# Patient Record
Sex: Male | Born: 1975 | Race: White | Hispanic: No | Marital: Married | Smoking: Current every day smoker
Health system: Southern US, Community
[De-identification: ages and names within clinical notes are randomized; demographics above are authoritative.]

## PROBLEM LIST (undated history)

## (undated) DIAGNOSIS — M5412 Radiculopathy, cervical region: Secondary | ICD-10-CM

## (undated) DIAGNOSIS — R200 Anesthesia of skin: Secondary | ICD-10-CM

## (undated) DIAGNOSIS — K219 Gastro-esophageal reflux disease without esophagitis: Secondary | ICD-10-CM

## (undated) DIAGNOSIS — I10 Essential (primary) hypertension: Secondary | ICD-10-CM

## (undated) DIAGNOSIS — L732 Hidradenitis suppurativa: Secondary | ICD-10-CM

## (undated) DIAGNOSIS — J45909 Unspecified asthma, uncomplicated: Secondary | ICD-10-CM

## (undated) DIAGNOSIS — F32A Depression, unspecified: Secondary | ICD-10-CM

## (undated) DIAGNOSIS — M1990 Unspecified osteoarthritis, unspecified site: Secondary | ICD-10-CM

## (undated) DIAGNOSIS — F329 Major depressive disorder, single episode, unspecified: Secondary | ICD-10-CM

## (undated) DIAGNOSIS — Z8614 Personal history of Methicillin resistant Staphylococcus aureus infection: Secondary | ICD-10-CM

## (undated) DIAGNOSIS — Z973 Presence of spectacles and contact lenses: Secondary | ICD-10-CM

## (undated) DIAGNOSIS — F419 Anxiety disorder, unspecified: Secondary | ICD-10-CM

## (undated) HISTORY — PX: NO PAST SURGERIES: SHX2092

---
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
## 2008-01-19 ENCOUNTER — Emergency Department: Payer: Self-pay | Admitting: Emergency Medicine

## 2008-09-08 ENCOUNTER — Ambulatory Visit: Payer: Self-pay | Admitting: Internal Medicine

## 2009-02-26 ENCOUNTER — Ambulatory Visit: Payer: Self-pay | Admitting: Internal Medicine

## 2009-03-09 ENCOUNTER — Ambulatory Visit: Payer: Self-pay | Admitting: Internal Medicine

## 2009-03-11 ENCOUNTER — Ambulatory Visit: Payer: Self-pay | Admitting: Internal Medicine

## 2009-04-07 ENCOUNTER — Ambulatory Visit: Payer: Self-pay | Admitting: Family Medicine

## 2009-05-25 ENCOUNTER — Ambulatory Visit: Payer: Self-pay | Admitting: Internal Medicine

## 2009-09-06 ENCOUNTER — Ambulatory Visit: Payer: Self-pay | Admitting: Internal Medicine

## 2010-01-04 ENCOUNTER — Ambulatory Visit: Payer: Self-pay | Admitting: Internal Medicine

## 2010-02-14 ENCOUNTER — Ambulatory Visit: Payer: Self-pay | Admitting: Internal Medicine

## 2010-04-18 ENCOUNTER — Ambulatory Visit: Payer: Self-pay | Admitting: Internal Medicine

## 2010-05-15 ENCOUNTER — Ambulatory Visit: Payer: Self-pay | Admitting: Internal Medicine

## 2010-08-13 ENCOUNTER — Ambulatory Visit: Payer: Self-pay | Admitting: Internal Medicine

## 2010-10-07 ENCOUNTER — Ambulatory Visit: Payer: Self-pay | Admitting: Internal Medicine

## 2011-03-17 ENCOUNTER — Ambulatory Visit: Payer: Self-pay

## 2011-04-12 ENCOUNTER — Ambulatory Visit: Payer: Self-pay | Admitting: Internal Medicine

## 2011-06-09 ENCOUNTER — Ambulatory Visit: Payer: Self-pay | Admitting: Internal Medicine

## 2011-06-26 ENCOUNTER — Encounter: Payer: Self-pay | Admitting: General Practice

## 2011-07-17 ENCOUNTER — Encounter: Payer: Self-pay | Admitting: General Practice

## 2011-07-19 ENCOUNTER — Ambulatory Visit: Payer: Self-pay | Admitting: Family Medicine

## 2011-07-19 ENCOUNTER — Emergency Department: Payer: Self-pay | Admitting: *Deleted

## 2011-07-19 LAB — URINALYSIS, COMPLETE
Bacteria: NEGATIVE
Bilirubin,UR: NEGATIVE
Glucose,UR: NEGATIVE mg/dL (ref 0–75)
Ketone: NEGATIVE
Leukocyte Esterase: NEGATIVE
Nitrite: NEGATIVE

## 2011-07-19 LAB — CBC WITH DIFFERENTIAL/PLATELET
Eosinophil #: 0.2 10*3/uL (ref 0.0–0.7)
Eosinophil %: 1.5 %
HCT: 50.1 % (ref 40.0–52.0)
Lymphocyte #: 0.6 10*3/uL — ABNORMAL LOW (ref 1.0–3.6)
Lymphocyte %: 4.1 %
Monocyte #: 0.4 10*3/uL (ref 0.0–0.7)
Neutrophil %: 89.2 %
RDW: 12.6 % (ref 11.5–14.5)
WBC: 14.2 10*3/uL — ABNORMAL HIGH (ref 3.8–10.6)

## 2011-07-19 LAB — COMPREHENSIVE METABOLIC PANEL
Alkaline Phosphatase: 100 U/L (ref 50–136)
BUN: 17 mg/dL (ref 7–18)
Bilirubin,Total: 0.5 mg/dL (ref 0.2–1.0)
Co2: 23 mmol/L (ref 21–32)
Potassium: 4 mmol/L (ref 3.5–5.1)
SGOT(AST): 23 U/L (ref 15–37)
SGPT (ALT): 34 U/L

## 2011-07-19 LAB — AMYLASE: Amylase: 39 U/L (ref 25–115)

## 2011-07-19 LAB — LIPASE, BLOOD: Lipase: 122 U/L (ref 73–393)

## 2011-11-24 ENCOUNTER — Ambulatory Visit: Payer: Self-pay | Admitting: Surgery

## 2012-01-17 ENCOUNTER — Other Ambulatory Visit: Payer: Self-pay | Admitting: Unknown Physician Specialty

## 2012-01-17 LAB — COMPREHENSIVE METABOLIC PANEL
Albumin: 4.3 g/dL (ref 3.4–5.0)
Alkaline Phosphatase: 99 U/L (ref 50–136)
Anion Gap: 11 (ref 7–16)
BUN: 9 mg/dL (ref 7–18)
Calcium, Total: 9.2 mg/dL (ref 8.5–10.1)
Co2: 24 mmol/L (ref 21–32)
EGFR (African American): >60
Glucose: 90 mg/dL (ref 65–99)
Osmolality: 278 (ref 275–301)
Potassium: 3.7 mmol/L (ref 3.5–5.1)
SGOT(AST): 27 U/L (ref 15–37)
SGPT (ALT): 37 U/L (ref 12–78)
Sodium: 140 mmol/L (ref 136–145)
Total Protein: 8.4 g/dL — ABNORMAL HIGH (ref 6.4–8.2)

## 2012-01-17 LAB — CBC WITH DIFFERENTIAL/PLATELET
Eosinophil %: 0.6 %
HCT: 46.1 % (ref 40.0–52.0)
Lymphocyte #: 2.6 10*3/uL (ref 1.0–3.6)
Lymphocyte %: 28.8 %
MCV: 88 fL (ref 80–100)
Monocyte %: 4.3 %
Platelet: 364 10*3/uL (ref 150–440)
RBC: 5.25 10*6/uL (ref 4.40–5.90)
WBC: 9.1 10*3/uL (ref 3.8–10.6)

## 2012-01-17 LAB — SEDIMENTATION RATE: Erythrocyte Sed Rate: 3 mm/hr (ref 0–15)

## 2012-01-26 ENCOUNTER — Ambulatory Visit: Payer: Self-pay | Admitting: Unknown Physician Specialty

## 2012-01-31 LAB — PATHOLOGY REPORT

## 2012-07-28 ENCOUNTER — Ambulatory Visit: Payer: Self-pay

## 2013-01-14 ENCOUNTER — Ambulatory Visit: Payer: Self-pay | Admitting: Urgent Care

## 2013-02-11 ENCOUNTER — Other Ambulatory Visit: Payer: Self-pay | Admitting: Family Medicine

## 2013-02-11 LAB — LIPID PANEL
Cholesterol: 267 mg/dL — ABNORMAL HIGH (ref 0–200)
Ldl Cholesterol, Calc: 160 mg/dL — ABNORMAL HIGH (ref 0–100)
Triglycerides: 113 mg/dL (ref 0–200)
VLDL Cholesterol, Calc: 23 mg/dL (ref 5–40)

## 2015-03-12 ENCOUNTER — Ambulatory Visit
Admission: EM | Admit: 2015-03-12 | Discharge: 2015-03-12 | Disposition: A | Discharge status: Home / Self Care | Payer: BLUE CROSS/BLUE SHIELD | Attending: Family Medicine | Admitting: Family Medicine

## 2015-03-12 ENCOUNTER — Encounter: Payer: Self-pay | Admitting: Emergency Medicine

## 2015-03-12 DIAGNOSIS — L02415 Cutaneous abscess of right lower limb: Secondary | ICD-10-CM

## 2015-03-12 DIAGNOSIS — L03115 Cellulitis of right lower limb: Secondary | ICD-10-CM

## 2015-03-12 MED ORDER — MUPIROCIN 2 % EX OINT: 1.0000 "application " | TOPICAL_OINTMENT | Freq: Two times a day (BID) | CUTANEOUS | Status: AC

## 2015-03-12 MED ORDER — SULFAMETHOXAZOLE-TRIMETHOPRIM 800-160 MG PO TABS: 1.0000 | ORAL_TABLET | Freq: Two times a day (BID) | ORAL | Status: AC

## 2015-03-12 MED ORDER — HYDROCODONE-ACETAMINOPHEN 5-325 MG PO TABS: 1.0000 | ORAL_TABLET | Freq: Every evening | ORAL | Status: AC | PRN

## 2015-03-12 NOTE — ED Provider Notes (Signed)
CSN: [id number]     Arrival date & time 03/12/15  1329 History   First MD Initiated Contact with Patient 03/12/15 1453     Chief Complaint  Patient presents with  . 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  +MRSA wound cultures in the past resolved with keflex or bactrim.  Has bactroban at home not using.  Does not require work not for today on paternity leave son born six weeks ago. Ultram  and motrin  along with hibiclens showers not helping.  The history is provided by the patient.    History reviewed. No pertinent past medical history. Past Surgical History  Procedure Laterality Date  . No past surgeries     No family history on file. Social History  Substance Use Topics  . Smoking status: Never Smoker   . Smokeless tobacco: None  . Alcohol Use: No    Review of Systems  Constitutional: Negative for fever and chills.  HENT: Negative for congestion, ear pain and sore throat.   Eyes: Negative for pain and discharge.  Respiratory: Negative for cough and wheezing.   Cardiovascular: Negative for chest pain and leg swelling.  Gastrointestinal: Negative for nausea, vomiting, diarrhea, constipation and blood in stool.  Genitourinary: Negative for dysuria, hematuria and difficulty urinating.  Musculoskeletal: Positive for myalgias. Negative for back pain and arthralgias.  Skin: Positive for color change and rash. Negative for pallor and wound.  Allergic/Immunologic: Negative for environmental allergies and food allergies.  Neurological: Negative for headaches.  Hematological: Negative for adenopathy. Does not bruise/bleed easily.  Psychiatric/Behavioral: Negative for confusion and agitation. The patient is not nervous/anxious.     Allergies  Ivp  dye  Home Medications   Prior to Admission medications   Medication Sig Start Date End Date Taking? Authorizing Provider  HYDROcodone-acetaminophen (NORCO/VICODIN) 5-325 MG tablet Take 1 tablet by mouth at bedtime as needed for severe pain. 03/12/15 03/17/15  Barbaraann Barthel, NP  mupirocin ointment (BACTROBAN) 2 % Apply 1 application topically 2 (two) times daily. 03/12/15 03/22/15  Barbaraann Barthel, NP  sulfamethoxazole-trimethoprim (BACTRIM DS,SEPTRA DS) 800-160 MG tablet Take 1 tablet by mouth 2 (two) times daily. 03/12/15 03/21/15  Barbaraann Barthel, NP   Meds Ordered and Administered this Visit  Medications - No data to display  BP 141/100 mmHg  Pulse 94  Temp(Src) 97.6 F (36.4 C) (Tympanic)  Resp 16  Ht  (1.702 m)  Wt 205 lb (92.987 kg)  BMI 32.10 kg/m2  SpO2 98% No data found.   Physical Exam  Constitutional: He is oriented to person, place, and time. Vital signs are normal. He appears well-developed and well-nourished. No distress.  HENT:  Head: Normocephalic and atraumatic.  Right Ear: External ear normal.  Left Ear: External ear normal.  Nose: Nose normal.  Mouth/Throat: Oropharynx is clear and moist. No oropharyngeal exudate.  Eyes: Conjunctivae, EOM and lids are normal. Pupils are equal, round, and reactive to light. Right eye exhibits no discharge. Left eye exhibits no discharge. No scleral icterus.  Neck: Trachea normal and normal range of motion. Neck supple. No tracheal deviation present.  Cardiovascular: Normal rate, regular rhythm, normal heart sounds and intact distal pulses.   Pulmonary/Chest: Effort normal and breath sounds normal. No stridor. No respiratory distress. He has no wheezes.  Abdominal: Soft.  Musculoskeletal: Normal range of motion. He exhibits  edema and tenderness.       Right shoulder: Normal.       Left shoulder: Normal.       Right elbow: Normal.      Left elbow: Normal.       Right hip: Normal.       Left hip: Normal.        Right knee: Normal.       Left knee: Normal.       Right ankle: Normal.       Left ankle: Normal.       Right hand: Normal.       Left hand: Normal.       Right upper leg: He exhibits tenderness, swelling and edema. He exhibits no bony tenderness, no deformity and no laceration.       Left upper leg: Normal.       Legs: Right medial thigh macular erythema 5x7cm fluctuance centrally less than 1x2cm induration surrounding fluctuance another cm diameter; scarring noted right medial thigh TTP hot dry  Lymphadenopathy:       Right: No inguinal adenopathy present.       Left: No inguinal adenopathy present.  Neurological: He is alert and oriented to person, place, and time. He displays no atrophy and no tremor. No cranial nerve deficit or sensory deficit. He exhibits normal muscle tone. He displays no seizure activity. Coordination and gait normal. GCS eye subscore is 4. GCS verbal subscore is 5. GCS motor subscore is 6.  Skin: Skin is warm, dry and intact. Rash noted. No abrasion, no bruising, no burn, no ecchymosis, no laceration, no lesion, no petechiae and no purpura noted. Rash is macular and pustular. Rash is not papular, not maculopapular, not nodular, not vesicular and not urticarial. He is not diaphoretic. There is erythema. No cyanosis. No pallor. Nails show no clubbing.  Psychiatric: He has a normal mood and affect. His speech is normal and behavior is normal. Judgment and thought content normal. Cognition and memory are normal.    ED Course  .Marland Kitchen.Incision and Drainage Date/Time: 03/12/2015 3:23 PM Performed by: Albina BilletBETANCOURT, Windell Musson A Authorized by: Hassan RowanWADE, EUGENE Consent: Verbal consent obtained. Risks and benefits: risks, benefits and alternatives were discussed Consent given by: patient Patient understanding: patient states understanding of the procedure being performed Site marked: the operative site was marked Patient identity confirmed: verbally with patient, provided demographic data  and arm band Time out: Immediately prior to procedure a "time out" was called to verify the correct patient, procedure, equipment, support staff and site/side marked as required. Type: abscess Body area: lower extremity Location details: right leg Anesthesia: local infiltration Local anesthetic: lidocaine 1% without epinephrine Anesthetic total: 6 ml Patient sedated: no Scalpel size: 11 Needle gauge: 25. Incision type: single straight Incision depth: subcutaneous Complexity: simple Drainage: purulent,  serosanguinous and  bloody Drainage amount: moderate Wound treatment: wound left open Packing material: none Patient tolerance: Patient tolerated the procedure well with no immediate complications Comments: Skin cleansed with hibiclens, alcohol wipes and betadine swabs.  EBL less than 5ml; yellow cloudy 2-723ml expressed after linear incision right medial thigh 0.5cm; sterile gauze pressure applied controlled bleeding triple antibiotic applied then gauze with bandaid over affected area.  Patient reported discomfort to area decreased after I&D and local infilltration.   (including critical care time)  Labs Review Labs Reviewed - No data to display  Imaging Review No results found.  Reviewed [place] Controlled substances website 2016 02/03/2015 HYDROCODONE- CHLORPHEN ER SUSP [id number] 240 24  0 0 [id number] Dortha Kern MD [place], [place] [id number] [place] [facility] [place], [place], Advith September 17, 1975 [address] [place], [place] [id number] 04 0  07/15/2014 04/03/2014 ALPRAZOLAM 0.5 MG TABLET [id number] 60 30 0 0 [id number] BLISS Doreene Nest MD [place], [place] [id number] [place] [facility] [place], [place], Lorenz Nov 24, 1975 [address] [place], [place] [id number] 04 0  05/03/2014 11/20/2013 TRAMADOL HCL 50 MG TABLET [id number] 60 [id number] BLISS Doreene Nest MD [place], [place] [id number] [place] [facility] [place], [place],  Aleck 1975-07-15 [address] [place], [place] [id number] 04 20  04/23/2014 04/23/2014 HYDROCODONE- CHLORPHEN ER SUSP [id number] 240 24 0 0 [id number] BLISS Doreene Nest MD [place], [place] [id number] [place] [facility] [place], [place], Ciel 07-Nov-1975 [address] [place], [place] [address] 04 0  04/01/2014 04/01/2014 ALPRAZOLAM 0.5 MG TABLET [id number] 60 30 0 0 [id number] BLISS Doreene Nest MD [place], [place] [id number] [place] [facility] [place], [place], Ary November 29, 1975 [address] [place], [place] [address] 04 0  03/30/2014 11/20/2013 TRAMADOL HCL 50 MG TABLET [id number] 60 [id number] BLISS Doreene Nest MD [place], [place] [id number] [place] [facility] [place], [place] Heishman, Jerid 1975-07-16 [address] [place],  [id number] 04 20   MDM   1. Abscess of right thigh   2. Cellulitis of right lower extremity    Time out performed, correct patient (patient identifiers: name and date of birth), correct procedure, correct body part, correct laterality. Pt was prepped with hibiclens, alcohol wipe and betadine swaps.  Local infiltration lesions performed with 1% lidocaine with good results.   Homeostasis was obtained with direct pressure. Superior lesion right medial thigh triple antibiotic was then applied followed by a simple dressing.  The patient tolerated the procedure well, no apparent complications.  No specimen sent to pathology.  exitcare handout on cellulitis and abscess aftercare incision and drainage given to patient.  Wound care instructions provided.  Observe for any signs of infection or other problems e.g. Fever, worsening pain/purulent drainage/swelling, red streaks. Patient to follow up with PCM if no improvement despite prescribed treatment x 48 hours.  Motrin  po TID and tylenol  po QID prn.  Ultram as prescribed may take 1 hydrocodone at bedtime if no relief with NSAIDs and ultram x 2 hours.  Bactroban apply BID affected area,  change dressing as saturated, hibiclens showers continue at least daily.  Bactrim DS po BID and bactroban or OTC antibiotic to affected areas twice a day.  May shower.  Do not soak in tub, hot tub, lake, pool until affected area healed.   Patient verbalized agreement and understanding of treatment plan and had no further questions at this time.  P2:  Wound Care, hand washing    Barbaraann Barthel, NP 03/12/15 1807

## 2015-03-12 NOTE — Discharge Instructions (Signed)
Abscess °An abscess is an infected area that contains a collection of pus and debris. It can occur in almost any part of the body. An abscess is also known as a furuncle or boil. °CAUSES  °An abscess occurs when tissue gets infected. This can occur from blockage of oil or sweat glands, infection of hair follicles, or a minor injury to the skin. As the body tries to fight the infection, pus collects in the area and creates pressure under the skin. This pressure causes pain. People with weakened immune systems have difficulty fighting infections and get certain abscesses more often.  °SYMPTOMS °Usually an abscess develops on the skin and becomes a painful mass that is red, warm, and tender. If the abscess forms under the skin, you may feel a moveable soft area under the skin. Some abscesses break open (rupture) on their own, but most will continue to get worse without care. The infection can spread deeper into the body and eventually into the bloodstream, causing you to feel ill.  °DIAGNOSIS  °Your caregiver will take your medical history and perform a physical exam. A sample of fluid may also be taken from the abscess to determine what is causing your infection. °TREATMENT  °Your caregiver may prescribe antibiotic medicines to fight the infection. However, taking antibiotics alone usually does not cure an abscess. Your caregiver may need to make a small cut (incision) in the abscess to drain the pus. In some cases, gauze is packed into the abscess to reduce pain and to continue draining the area. °HOME CARE INSTRUCTIONS  °· Only take over-the-counter or prescription medicines for pain, discomfort, or fever as directed by your caregiver. °· If you were prescribed antibiotics, take them as directed. Finish them even if you start to feel better. °· If gauze is used, follow your caregiver's directions for changing the gauze. °· To avoid spreading the infection: °· Keep your draining abscess covered with a  bandage. °· Wash your hands well. °· Do not share personal care items, towels, or whirlpools with others. °· Avoid skin contact with others. °· Keep your skin and clothes clean around the abscess. °· Keep all follow-up appointments as directed by your caregiver. °SEEK MEDICAL CARE IF:  °· You have increased pain, swelling, redness, fluid drainage, or bleeding. °· You have muscle aches, chills, or a general ill feeling. °· You have a fever. °MAKE SURE YOU:  °· Understand these instructions. °· Will watch your condition. °· Will get help right away if you are not doing well or get worse. °  °This information is not intended to replace advice given to you by your health care provider. Make sure you discuss any questions you have with your health care provider. °  °Document Released: 01/11/2005 Document Revised: 10/03/2011 Document Reviewed: 06/16/2011 °Elsevier Interactive Patient Education ©2016 [employer] Inc[employer] ° °Cellulitis °Cellulitis is an infection of the skin and the tissue beneath it. The infected area is usually red and tender. Cellulitis occurs most often in the arms and lower legs.  °CAUSES  °Cellulitis is caused by bacteria that enter the skin through cracks or cuts in the skin. The most common types of bacteria that cause cellulitis are staphylococci and streptococci. °SIGNS AND SYMPTOMS  °· Redness and warmth. °· Swelling. °· Tenderness or pain. °· Fever. °DIAGNOSIS  °Your health care provider can usually determine what is wrong based on a physical exam. Blood tests may also be done. °TREATMENT  °Treatment usually involves taking an antibiotic medicine. °HOME CARE INSTRUCTIONS  °·   Take your antibiotic medicine as directed by your health care provider. Finish the antibiotic even if you start to feel better. °· Keep the infected arm or leg elevated to reduce swelling. °· Apply a warm cloth to the affected area up to 4 times per day to relieve pain. °· Take medicines only as directed by your health care  provider. °· Keep all follow-up visits as directed by your health care provider. °SEEK MEDICAL CARE IF:  °· You notice red streaks coming from the infected area. °· Your red area gets larger or turns dark in color. °· Your bone or joint underneath the infected area becomes painful after the skin has healed. °· Your infection returns in the same area or another area. °· You notice a swollen bump in the infected area. °· You develop new symptoms. °· You have a fever. °SEEK IMMEDIATE MEDICAL CARE IF:  °· You feel very sleepy. °· You develop vomiting or diarrhea. °· You have a general ill feeling (malaise) with muscle aches and pains. °  °This information is not intended to replace advice given to you by your health care provider. Make sure you discuss any questions you have with your health care provider. °  °Document Released: 01/11/2005 Document Revised: 12/23/2014 Document Reviewed: 06/19/2011 °Elsevier Interactive Patient Education ©2016 [employer] Inc[employer] ° °Incision and Drainage °Incision and drainage is a procedure in which a sac-like structure (cystic structure) is opened and drained. The area to be drained usually contains material such as pus, fluid, or blood.  °LET YOUR CAREGIVER KNOW ABOUT:  °· Allergies to medicine. °· Medicines taken, including vitamins, herbs, eyedrops, over-the-counter medicines, and creams. °· Use of steroids (by mouth or creams). °· Previous problems with anesthetics or numbing medicines. °· History of bleeding problems or blood clots. °· Previous surgery. °· Other health problems, including diabetes and kidney problems. °· Possibility of pregnancy, if this applies. °RISKS AND COMPLICATIONS °· Pain. °· Bleeding. °· Scarring. °· Infection. °BEFORE THE PROCEDURE  °You may need to have an ultrasound or other imaging tests to see how large or deep your cystic structure is. Blood tests may also be used to determine if you have an infection or how severe the infection is. You may need to have a  tetanus shot. °PROCEDURE  °The affected area is cleaned with a cleaning fluid. The cyst area will then be numbed with a medicine (local anesthetic). A small incision will be made in the cystic structure. A syringe or catheter may be used to drain the contents of the cystic structure, or the contents may be squeezed out. The area will then be flushed with a cleansing solution. After cleansing the area, it is often gently packed with a gauze or another wound dressing. Once it is packed, it will be covered with gauze and tape or some other type of wound dressing.  °AFTER THE PROCEDURE  °· Often, you will be allowed to go home right after the procedure. °· You may be given antibiotic medicine to prevent or heal an infection. °· If the area was packed with gauze or some other wound dressing, you will likely need to come back in 1 to 2 days to get it removed. °· The area should heal in about 14 days. °  °This information is not intended to replace advice given to you by your health care provider. Make sure you discuss any questions you have with your health care provider. °  °Document Released: 09/27/2000 Document Revised: 10/03/2011 Document Reviewed: 05/29/2011 °  Elsevier Interactive Patient Education ©2016 Elsevier Inc. ° °

## 2015-03-12 NOTE — ED Notes (Signed)
Right inner thigh abscess  for 5 days. Large, red and painful.

## 2015-04-01 IMAGING — CR DG CHEST 2V
1 series · 1 of 1 positions shown · non-contrast
Comparison: none

REASON FOR EXAM: cold sweats
COMMENTS:

[w chest pa]
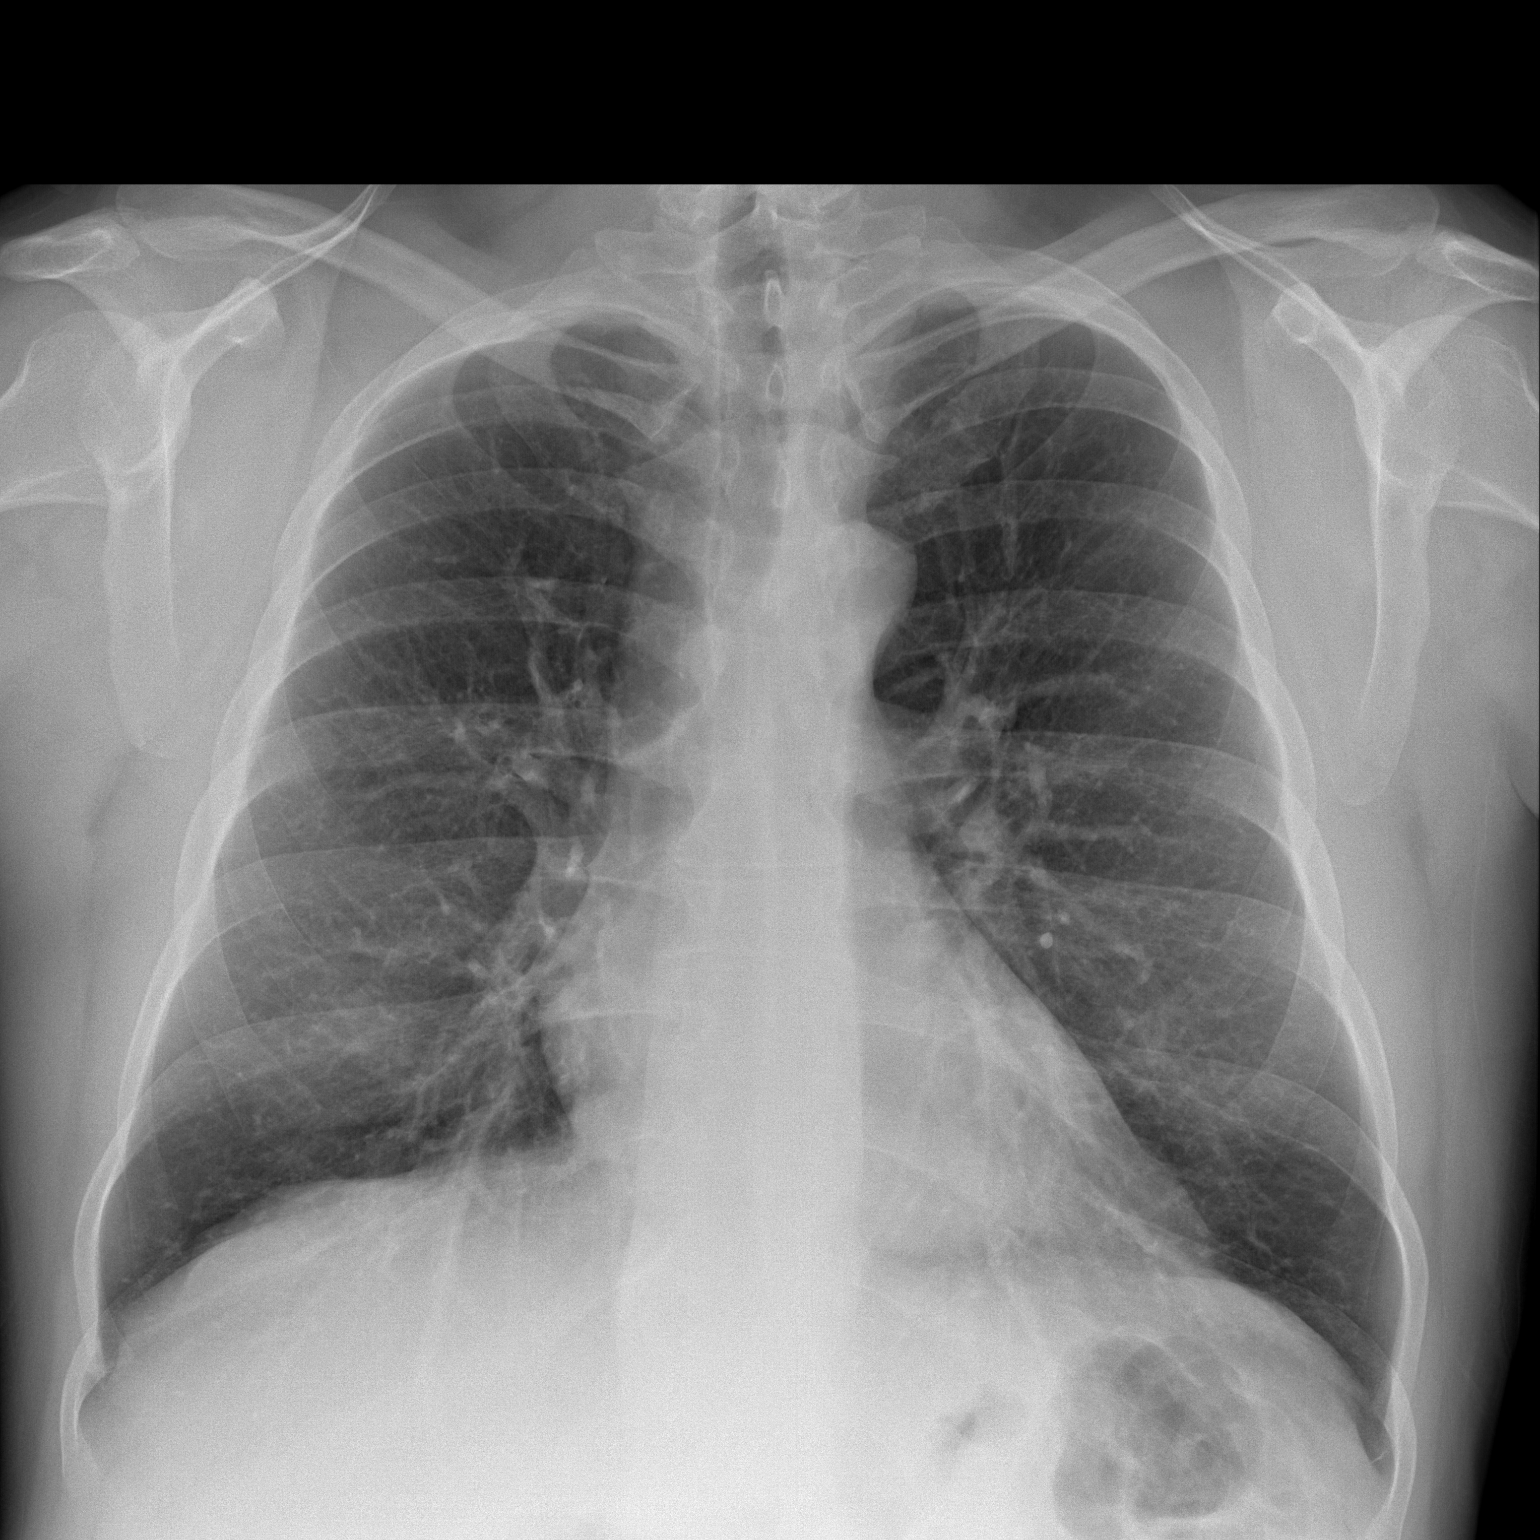

[1 of 1 positions shown; findings below may reference images not displayed]

PROCEDURE:     DXR - DXR CHEST PA (OR AP) AND LATERAL  - January 14, 2013 [DATE]

RESULT:     The lungs are well-expanded. There is no focal infiltrate. The
cardiac silhouette is normal in size. The pulmonary vascularity is not
engorged. The mediastinum is normal in width. There is no pleural effusion
or pneumothorax. The observed portions of the bony thorax appear normal.
IMPRESSION: There is no evidence of acute cardiopulmonary abnormality.

[REDACTED]

## 2015-04-18 DIAGNOSIS — Z8614 Personal history of Methicillin resistant Staphylococcus aureus infection: Secondary | ICD-10-CM

## 2015-04-18 DIAGNOSIS — L732 Hidradenitis suppurativa: Secondary | ICD-10-CM

## 2015-04-18 HISTORY — DX: Personal history of Methicillin resistant Staphylococcus aureus infection: Z86.14

## 2015-04-18 HISTORY — DX: Hidradenitis suppurativa: L73.2

## 2015-07-14 ENCOUNTER — Ambulatory Visit
Admission: EM | Admit: 2015-07-14 | Discharge: 2015-07-14 | Discharge status: Absent without leave | Payer: BLUE CROSS/BLUE SHIELD

## 2019-01-27 ENCOUNTER — Other Ambulatory Visit: Payer: Self-pay

## 2019-01-27 ENCOUNTER — Other Ambulatory Visit (HOSPITAL_COMMUNITY)
Admission: RE | Admit: 2019-01-27 | Discharge: 2019-01-27 | Disposition: A | Discharge status: Home / Self Care | Payer: BC Managed Care – PPO | Source: Ambulatory Visit | Attending: Orthopaedic Surgery | Admitting: Orthopaedic Surgery

## 2019-01-27 ENCOUNTER — Encounter (HOSPITAL_BASED_OUTPATIENT_CLINIC_OR_DEPARTMENT_OTHER)
Admission: RE | Admit: 2019-01-27 | Discharge: 2019-01-27 | Disposition: A | Discharge status: Home / Self Care | Payer: BC Managed Care – PPO | Source: Ambulatory Visit | Attending: Orthopaedic Surgery | Admitting: Orthopaedic Surgery

## 2019-01-27 ENCOUNTER — Encounter (HOSPITAL_BASED_OUTPATIENT_CLINIC_OR_DEPARTMENT_OTHER): Payer: Self-pay | Admitting: *Deleted

## 2019-01-27 DIAGNOSIS — M19011 Primary osteoarthritis, right shoulder: Secondary | ICD-10-CM | POA: Diagnosis not present

## 2019-01-27 DIAGNOSIS — Z87891 Personal history of nicotine dependence: Secondary | ICD-10-CM | POA: Diagnosis not present

## 2019-01-27 DIAGNOSIS — X58XXXA Exposure to other specified factors, initial encounter: Secondary | ICD-10-CM | POA: Diagnosis not present

## 2019-01-27 DIAGNOSIS — Z01812 Encounter for preprocedural laboratory examination: Secondary | ICD-10-CM | POA: Insufficient documentation

## 2019-01-27 DIAGNOSIS — F329 Major depressive disorder, single episode, unspecified: Secondary | ICD-10-CM | POA: Diagnosis not present

## 2019-01-27 DIAGNOSIS — J45909 Unspecified asthma, uncomplicated: Secondary | ICD-10-CM | POA: Diagnosis not present

## 2019-01-27 DIAGNOSIS — Z20828 Contact with and (suspected) exposure to other viral communicable diseases: Secondary | ICD-10-CM | POA: Insufficient documentation

## 2019-01-27 DIAGNOSIS — I10 Essential (primary) hypertension: Secondary | ICD-10-CM | POA: Diagnosis not present

## 2019-01-27 DIAGNOSIS — Z791 Long term (current) use of non-steroidal anti-inflammatories (NSAID): Secondary | ICD-10-CM | POA: Diagnosis not present

## 2019-01-27 DIAGNOSIS — S43431A Superior glenoid labrum lesion of right shoulder, initial encounter: Secondary | ICD-10-CM | POA: Diagnosis not present

## 2019-01-27 DIAGNOSIS — F419 Anxiety disorder, unspecified: Secondary | ICD-10-CM | POA: Diagnosis not present

## 2019-01-27 DIAGNOSIS — Z79899 Other long term (current) drug therapy: Secondary | ICD-10-CM | POA: Diagnosis not present

## 2019-01-27 DIAGNOSIS — M7541 Impingement syndrome of right shoulder: Secondary | ICD-10-CM | POA: Diagnosis not present

## 2019-01-27 NOTE — Progress Notes (Signed)

## 2019-01-27 NOTE — Progress Notes (Signed)
UTR pt. Spoke wit Sherri at Dr Sharyon Cable and asked her to try to reach pt and have him call me. Will need PAT appt and needs covid screen today.

## 2019-01-28 LAB — SARS CORONAVIRUS 2 (TAT 6-24 HRS): SARS Coronavirus 2: NEGATIVE

## 2019-01-29 NOTE — Anesthesia Preprocedure Evaluation (Addendum)
Anesthesia Evaluation  Patient identified by MRN, date of birth, ID band Patient awake    Reviewed: Allergy & Precautions, NPO status , Patient's Chart, lab work & pertinent test results, reviewed documented beta blocker date and time   History of Anesthesia Complications Negative for: history of anesthetic complications  Airway Mallampati: III  TM Distance: >3 FB Neck ROM: Full    Dental no notable dental hx. (+) Dental Advisory Given   Pulmonary neg pulmonary ROS, Patient abstained from smoking., former smoker,    Pulmonary exam normal        Cardiovascular hypertension, Pt. on home beta blockers Normal cardiovascular exam     Neuro/Psych PSYCHIATRIC DISORDERS Anxiety Depression negative neurological ROS     GI/Hepatic negative GI ROS, Neg liver ROS,   Endo/Other  negative endocrine ROS  Renal/GU negative Renal ROS     Musculoskeletal negative musculoskeletal ROS (+)   Abdominal   Peds  Hematology negative hematology ROS (+)   Anesthesia Other Findings Day of surgery medications reviewed with the patient.  Reproductive/Obstetrics                            Anesthesia Physical Anesthesia Plan  ASA: II  Anesthesia Plan: General   Post-op Pain Management:  Regional for Post-op pain   Induction: Intravenous  PONV Risk Score and Plan: 2 and Ondansetron and Scopolamine patch - Pre-op  Airway Management Planned: Oral ETT  Additional Equipment:   Intra-op Plan:   Post-operative Plan: Extubation in OR  Informed Consent: I have reviewed the patients History and Physical, chart, labs and discussed the procedure including the risks, benefits and alternatives for the proposed anesthesia with the patient or authorized representative who has indicated his/her understanding and acceptance.     Dental advisory given  Plan Discussed with: CRNA and Anesthesiologist  Anesthesia Plan  Comments:        Anesthesia Quick Evaluation

## 2019-01-30 ENCOUNTER — Encounter (HOSPITAL_BASED_OUTPATIENT_CLINIC_OR_DEPARTMENT_OTHER): Payer: Self-pay

## 2019-01-30 ENCOUNTER — Ambulatory Visit (HOSPITAL_BASED_OUTPATIENT_CLINIC_OR_DEPARTMENT_OTHER): Payer: BC Managed Care – PPO | Admitting: Anesthesiology

## 2019-01-30 ENCOUNTER — Other Ambulatory Visit: Payer: Self-pay

## 2019-01-30 ENCOUNTER — Encounter (HOSPITAL_BASED_OUTPATIENT_CLINIC_OR_DEPARTMENT_OTHER)
Admission: RE | Disposition: A | Discharge status: Home / Self Care | Payer: Self-pay | Source: Home / Self Care | Attending: Orthopaedic Surgery

## 2019-01-30 ENCOUNTER — Ambulatory Visit (HOSPITAL_BASED_OUTPATIENT_CLINIC_OR_DEPARTMENT_OTHER)
Admission: RE | Admit: 2019-01-30 | Discharge: 2019-01-30 | Disposition: A | Discharge status: Home / Self Care | Payer: BC Managed Care – PPO | Attending: Orthopaedic Surgery | Admitting: Orthopaedic Surgery

## 2019-01-30 DIAGNOSIS — Z791 Long term (current) use of non-steroidal anti-inflammatories (NSAID): Secondary | ICD-10-CM | POA: Insufficient documentation

## 2019-01-30 DIAGNOSIS — I10 Essential (primary) hypertension: Secondary | ICD-10-CM | POA: Insufficient documentation

## 2019-01-30 DIAGNOSIS — F419 Anxiety disorder, unspecified: Secondary | ICD-10-CM | POA: Insufficient documentation

## 2019-01-30 DIAGNOSIS — X58XXXA Exposure to other specified factors, initial encounter: Secondary | ICD-10-CM | POA: Insufficient documentation

## 2019-01-30 DIAGNOSIS — J45909 Unspecified asthma, uncomplicated: Secondary | ICD-10-CM | POA: Insufficient documentation

## 2019-01-30 DIAGNOSIS — Z87891 Personal history of nicotine dependence: Secondary | ICD-10-CM | POA: Insufficient documentation

## 2019-01-30 DIAGNOSIS — S43431A Superior glenoid labrum lesion of right shoulder, initial encounter: Secondary | ICD-10-CM | POA: Insufficient documentation

## 2019-01-30 DIAGNOSIS — M7541 Impingement syndrome of right shoulder: Secondary | ICD-10-CM | POA: Insufficient documentation

## 2019-01-30 DIAGNOSIS — F329 Major depressive disorder, single episode, unspecified: Secondary | ICD-10-CM | POA: Insufficient documentation

## 2019-01-30 DIAGNOSIS — Z79899 Other long term (current) drug therapy: Secondary | ICD-10-CM | POA: Insufficient documentation

## 2019-01-30 DIAGNOSIS — M19011 Primary osteoarthritis, right shoulder: Secondary | ICD-10-CM | POA: Diagnosis not present

## 2019-01-30 HISTORY — DX: Gastro-esophageal reflux disease without esophagitis: K21.9

## 2019-01-30 HISTORY — DX: Unspecified asthma, uncomplicated: J45.909

## 2019-01-30 HISTORY — DX: Anxiety disorder, unspecified: F41.9

## 2019-01-30 HISTORY — DX: Depression, unspecified: F32.A

## 2019-01-30 HISTORY — PX: BICEPT TENODESIS: SHX5116

## 2019-01-30 HISTORY — DX: Essential (primary) hypertension: I10

## 2019-01-30 SURGERY — SHOULDER ARTHROSCOPY WITH SUBACROMIAL DECOMPRESSION AND DISTAL CLAVICLE EXCISION
Anesthesia: General | Site: Shoulder | Laterality: Right

## 2019-01-30 MED ORDER — ACETAMINOPHEN 500 MG PO TABS: 1000.0000 mg | ORAL_TABLET | Freq: Three times a day (TID) | ORAL | 0 refills | Status: AC

## 2019-01-30 MED ORDER — ROCURONIUM BROMIDE 10 MG/ML (PF) SYRINGE
PREFILLED_SYRINGE | INTRAVENOUS | Status: AC
  Filled 2019-01-30: qty 10

## 2019-01-30 MED ORDER — DEXAMETHASONE SODIUM PHOSPHATE 10 MG/ML IJ SOLN
INTRAMUSCULAR | Status: AC
  Filled 2019-01-30: qty 1

## 2019-01-30 MED ORDER — LIDOCAINE HCL (CARDIAC) PF 100 MG/5ML IV SOSY
PREFILLED_SYRINGE | INTRAVENOUS | Status: DC | PRN
  Administered 2019-01-30: 40 mg via INTRAVENOUS

## 2019-01-30 MED ORDER — ACETAMINOPHEN 500 MG PO TABS
ORAL_TABLET | ORAL | Status: AC
  Filled 2019-01-30: qty 2

## 2019-01-30 MED ORDER — CELECOXIB 200 MG PO CAPS: 200.0000 mg | ORAL_CAPSULE | Freq: Two times a day (BID) | ORAL | 1 refills | Status: AC

## 2019-01-30 MED ORDER — ROCURONIUM BROMIDE 100 MG/10ML IV SOLN
INTRAVENOUS | Status: DC | PRN
  Administered 2019-01-30: 70 mg via INTRAVENOUS

## 2019-01-30 MED ORDER — CEFAZOLIN SODIUM-DEXTROSE 2-4 GM/100ML-% IV SOLN
2.0000 g | INTRAVENOUS | Status: AC
  Administered 2019-01-30: 2 g via INTRAVENOUS

## 2019-01-30 MED ORDER — EPINEPHRINE PF 1 MG/ML IJ SOLN
INTRAMUSCULAR | Status: AC
  Filled 2019-01-30: qty 8

## 2019-01-30 MED ORDER — FENTANYL CITRATE (PF) 100 MCG/2ML IJ SOLN
25.0000 ug | INTRAMUSCULAR | Status: DC | PRN
  Administered 2019-01-30: 50 ug via INTRAVENOUS
  Administered 2019-01-30: 25 ug via INTRAVENOUS

## 2019-01-30 MED ORDER — PROMETHAZINE HCL 25 MG/ML IJ SOLN: 6.2500 mg | INTRAMUSCULAR | Status: DC | PRN

## 2019-01-30 MED ORDER — FENTANYL CITRATE (PF) 100 MCG/2ML IJ SOLN
50.0000 ug | INTRAMUSCULAR | Status: DC | PRN
  Administered 2019-01-30: 100 ug via INTRAVENOUS

## 2019-01-30 MED ORDER — ONDANSETRON HCL 4 MG PO TABS: 4.0000 mg | ORAL_TABLET | Freq: Three times a day (TID) | ORAL | 1 refills | Status: AC | PRN

## 2019-01-30 MED ORDER — ONDANSETRON HCL 4 MG/2ML IJ SOLN
INTRAMUSCULAR | Status: DC | PRN
  Administered 2019-01-30: 4 mg via INTRAVENOUS

## 2019-01-30 MED ORDER — ACETAMINOPHEN 500 MG PO TABS
1000.0000 mg | ORAL_TABLET | Freq: Once | ORAL | Status: AC
  Administered 2019-01-30: 1000 mg via ORAL

## 2019-01-30 MED ORDER — CHLORHEXIDINE GLUCONATE 4 % EX LIQD: 60.0000 mL | Freq: Once | CUTANEOUS | Status: DC

## 2019-01-30 MED ORDER — DEXAMETHASONE SODIUM PHOSPHATE 4 MG/ML IJ SOLN
INTRAMUSCULAR | Status: DC | PRN
  Administered 2019-01-30: 5 mg via INTRAVENOUS

## 2019-01-30 MED ORDER — SODIUM CHLORIDE 0.9 % IR SOLN
Status: DC | PRN
  Administered 2019-01-30: 9500 mL
  Administered 2019-01-30: 6000 mL

## 2019-01-30 MED ORDER — CELECOXIB 400 MG PO CAPS
400.0000 mg | ORAL_CAPSULE | Freq: Once | ORAL | Status: AC
  Administered 2019-01-30: 400 mg via ORAL

## 2019-01-30 MED ORDER — OXYCODONE HCL 5 MG PO TABS: ORAL_TABLET | ORAL | 0 refills | Status: AC

## 2019-01-30 MED ORDER — LABETALOL HCL 5 MG/ML IV SOLN
INTRAVENOUS | Status: DC | PRN
  Administered 2019-01-30: 5 mg via INTRAVENOUS

## 2019-01-30 MED ORDER — MIDAZOLAM HCL 2 MG/2ML IJ SOLN
INTRAMUSCULAR | Status: AC
  Filled 2019-01-30: qty 2

## 2019-01-30 MED ORDER — CELECOXIB 200 MG PO CAPS
ORAL_CAPSULE | ORAL | Status: AC
  Filled 2019-01-30: qty 1

## 2019-01-30 MED ORDER — BUPIVACAINE LIPOSOME 1.3 % IJ SUSP
INTRAMUSCULAR | Status: DC | PRN
  Administered 2019-01-30: 10 mL via PERINEURAL

## 2019-01-30 MED ORDER — SUGAMMADEX SODIUM 200 MG/2ML IV SOLN
INTRAVENOUS | Status: DC | PRN
  Administered 2019-01-30: 200 mg via INTRAVENOUS

## 2019-01-30 MED ORDER — FENTANYL CITRATE (PF) 100 MCG/2ML IJ SOLN
INTRAMUSCULAR | Status: AC
  Filled 2019-01-30: qty 2

## 2019-01-30 MED ORDER — ONDANSETRON HCL 4 MG/2ML IJ SOLN
INTRAMUSCULAR | Status: AC
  Filled 2019-01-30: qty 2

## 2019-01-30 MED ORDER — PROPOFOL 10 MG/ML IV BOLUS
INTRAVENOUS | Status: DC | PRN
  Administered 2019-01-30: 20 mg via INTRAVENOUS
  Administered 2019-01-30: 30 mg via INTRAVENOUS
  Administered 2019-01-30: 200 mg via INTRAVENOUS

## 2019-01-30 MED ORDER — ALBUTEROL SULFATE (2.5 MG/3ML) 0.083% IN NEBU
2.5000 mg | INHALATION_SOLUTION | Freq: Once | RESPIRATORY_TRACT | Status: AC
  Administered 2019-01-30: 2.5 mg via RESPIRATORY_TRACT

## 2019-01-30 MED ORDER — SCOPOLAMINE 1 MG/3DAYS TD PT72
MEDICATED_PATCH | TRANSDERMAL | Status: AC
  Filled 2019-01-30: qty 1

## 2019-01-30 MED ORDER — SCOPOLAMINE 1 MG/3DAYS TD PT72
1.0000 | MEDICATED_PATCH | TRANSDERMAL | Status: DC
  Administered 2019-01-30: 1.5 mg via TRANSDERMAL

## 2019-01-30 MED ORDER — LIDOCAINE 2% (20 MG/ML) 5 ML SYRINGE
INTRAMUSCULAR | Status: AC
  Filled 2019-01-30: qty 5

## 2019-01-30 MED ORDER — MIDAZOLAM HCL 2 MG/2ML IJ SOLN
1.0000 mg | INTRAMUSCULAR | Status: DC | PRN
  Administered 2019-01-30: 2 mg via INTRAVENOUS

## 2019-01-30 MED ORDER — CEFAZOLIN SODIUM-DEXTROSE 2-4 GM/100ML-% IV SOLN
INTRAVENOUS | Status: AC
  Filled 2019-01-30: qty 100

## 2019-01-30 MED ORDER — LACTATED RINGERS IV SOLN
INTRAVENOUS | Status: DC
  Administered 2019-01-30 (×2): via INTRAVENOUS

## 2019-01-30 MED ORDER — ALBUTEROL SULFATE (2.5 MG/3ML) 0.083% IN NEBU
INHALATION_SOLUTION | RESPIRATORY_TRACT | Status: AC
  Filled 2019-01-30: qty 3

## 2019-01-30 MED ORDER — BUPIVACAINE HCL (PF) 0.25 % IJ SOLN
INTRAMUSCULAR | Status: DC | PRN
  Administered 2019-01-30: 15 mL

## 2019-01-30 SURGICAL SUPPLY — 67 items
ANCHOR SUT 1.8 FBRTK KNTLS 2SU (Anchor) ×6 IMPLANT
BENZOIN TINCTURE PRP APPL 2/3 (GAUZE/BANDAGES/DRESSINGS) ×3 IMPLANT
BLADE EXCALIBUR 4.0MM X 13CM (MISCELLANEOUS) ×1
BLADE EXCALIBUR 4.0X13 (MISCELLANEOUS) ×2 IMPLANT
BLADE SURG 10 STRL SS (BLADE) IMPLANT
BURR OVAL 8 FLU 4.0MM X 13CM (MISCELLANEOUS)
BURR OVAL 8 FLU 4.0X13 (MISCELLANEOUS) IMPLANT
CANNULA 5.75X71 LONG (CANNULA) IMPLANT
CANNULA PASSPORT 5 (CANNULA) ×2 IMPLANT
CANNULA PASSPORT 5CM (CANNULA) ×1
CANNULA PASSPORT BUTTON 10-40 (CANNULA) ×3 IMPLANT
CANNULA TWIST IN 8.25X7CM (CANNULA) IMPLANT
CHLORAPREP W/TINT 26 (MISCELLANEOUS) ×3 IMPLANT
CLOSURE WOUND 1/2 X4 (GAUZE/BANDAGES/DRESSINGS) ×1
COVER WAND RF STERILE (DRAPES) IMPLANT
DECANTER SPIKE VIAL GLASS SM (MISCELLANEOUS) IMPLANT
DISSECTOR 3.5MM X 13CM CVD (MISCELLANEOUS) IMPLANT
DISSECTOR 4.0MMX13CM CVD (MISCELLANEOUS) IMPLANT
DRAPE HALF SHEET 70X43 (DRAPES) IMPLANT
DRAPE IMP U-DRAPE 54X76 (DRAPES) ×3 IMPLANT
DRAPE INCISE IOBAN 66X45 STRL (DRAPES) IMPLANT
DRAPE SHOULDER BEACH CHAIR (DRAPES) ×3 IMPLANT
DRSG PAD ABDOMINAL 8X10 ST (GAUZE/BANDAGES/DRESSINGS) ×3 IMPLANT
DW OUTFLOW CASSETTE/TUBE SET (MISCELLANEOUS) ×3 IMPLANT
GAUZE SPONGE 4X4 12PLY STRL (GAUZE/BANDAGES/DRESSINGS) ×3 IMPLANT
GLOVE BIO SURGEON STRL SZ 6.5 (GLOVE) ×2 IMPLANT
GLOVE BIO SURGEONS STRL SZ 6.5 (GLOVE) ×1
GLOVE BIOGEL PI IND STRL 6.5 (GLOVE) ×1 IMPLANT
GLOVE BIOGEL PI IND STRL 7.0 (GLOVE) ×1 IMPLANT
GLOVE BIOGEL PI IND STRL 8 (GLOVE) ×1 IMPLANT
GLOVE BIOGEL PI INDICATOR 6.5 (GLOVE) ×2
GLOVE BIOGEL PI INDICATOR 7.0 (GLOVE) ×2
GLOVE BIOGEL PI INDICATOR 8 (GLOVE) ×2
GLOVE ECLIPSE 6.5 STRL STRAW (GLOVE) ×3 IMPLANT
GLOVE ECLIPSE 8.0 STRL XLNG CF (GLOVE) ×3 IMPLANT
GOWN STRL REUS W/ TWL LRG LVL3 (GOWN DISPOSABLE) ×2 IMPLANT
GOWN STRL REUS W/TWL LRG LVL3 (GOWN DISPOSABLE) ×4
GOWN STRL REUS W/TWL XL LVL3 (GOWN DISPOSABLE) ×3 IMPLANT
KIT STABILIZATION SHOULDER (MISCELLANEOUS) ×3 IMPLANT
KIT STR SPEAR 1.8 FBRTK DISP (KITS) ×3 IMPLANT
LASSO CRESCENT QUICKPASS (SUTURE) IMPLANT
MANIFOLD NEPTUNE II (INSTRUMENTS) ×3 IMPLANT
NDL SAFETY ECLIPSE 18X1.5 (NEEDLE) ×1 IMPLANT
NEEDLE HYPO 18GX1.5 SHARP (NEEDLE) ×2
NEEDLE SCORPION MULTI FIRE (NEEDLE) IMPLANT
PACK ARTHROSCOPY DSU (CUSTOM PROCEDURE TRAY) ×3 IMPLANT
PACK BASIN DAY SURGERY FS (CUSTOM PROCEDURE TRAY) ×3 IMPLANT
PAD ORTHO SHOULDER 7X19 LRG (SOFTGOODS) ×3 IMPLANT
PORT APPOLLO RF 90DEGREE MULTI (SURGICAL WAND) ×3 IMPLANT
RESTRAINT HEAD UNIVERSAL NS (MISCELLANEOUS) ×3 IMPLANT
SLEEVE SCD COMPRESS KNEE MED (MISCELLANEOUS) ×3 IMPLANT
SLING ARM FOAM STRAP LRG (SOFTGOODS) IMPLANT
STRIP CLOSURE SKIN 1/2X4 (GAUZE/BANDAGES/DRESSINGS) ×2 IMPLANT
SUT FIBERWIRE #2 38 T-5 BLUE (SUTURE)
SUT MNCRL AB 4-0 PS2 18 (SUTURE) ×3 IMPLANT
SUT PDS AB 1 CT  36 (SUTURE) ×2
SUT PDS AB 1 CT 36 (SUTURE) ×1 IMPLANT
SUT TIGER TAPE 7 IN WHITE (SUTURE) IMPLANT
SUTURE FIBERWR #2 38 T-5 BLUE (SUTURE) IMPLANT
SUTURE TAPE TIGERLINK 1.3MM BL (SUTURE) IMPLANT
SUTURETAPE TIGERLINK 1.3MM BL (SUTURE)
SYR 5ML LL (SYRINGE) ×3 IMPLANT
TAPE FIBER 2MM 7IN #2 BLUE (SUTURE) IMPLANT
TOWEL GREEN STERILE FF (TOWEL DISPOSABLE) ×3 IMPLANT
TUBE CONNECTING 20'X1/4 (TUBING) ×1
TUBE CONNECTING 20X1/4 (TUBING) ×2 IMPLANT
TUBING ARTHROSCOPY IRRIG 16FT (MISCELLANEOUS) ×3 IMPLANT

## 2019-01-30 NOTE — Discharge Instructions (Signed)
No Tylenol until 12:30pm No Ibuprofen until 2:30pm   Post Anesthesia Home Care Instructions  Activity: Get plenty of rest for the remainder of the day. A responsible individual must stay with you for 24 hours following the procedure.  For the next 24 hours, DO NOT: -Drive a car -Paediatric nurse -Drink alcoholic beverages -Take any medication unless instructed by your physician -Make any legal decisions or sign important papers.  Meals: Start with liquid foods such as gelatin or soup. Progress to regular foods as tolerated. Avoid greasy, spicy, heavy foods. If nausea and/or vomiting occur, drink only clear liquids until the nausea and/or vomiting subsides. Call your physician if vomiting continues.  Special Instructions/Symptoms: Your throat may feel dry or sore from the anesthesia or the breathing tube placed in your throat during surgery. If this causes discomfort, gargle with warm salt water. The discomfort should disappear within 24 hours.  If you had a scopolamine patch placed behind your ear for the management of post- operative nausea and/or vomiting:  1. The medication in the patch is effective for 72 hours, after which it should be removed.  Wrap patch in a tissue and discard in the trash. Wash hands thoroughly with soap and water. 2. You may remove the patch earlier than 72 hours if you experience unpleasant side effects which may include dry mouth, dizziness or visual disturbances. 3. Avoid touching the patch. Wash your hands with soap and water after contact with the patch.    Regional Anesthesia Blocks  1. Numbness or the inability to move the "blocked" extremity may last from 3-48 hours after placement. The length of time depends on the medication injected and your individual response to the medication. If the numbness is not going away after 48 hours, call your surgeon.  2. The extremity that is blocked will need to be protected until the numbness is gone and the   Strength has returned. Because you cannot feel it, you will need to take extra care to avoid injury. Because it may be weak, you may have difficulty moving it or using it. You may not know what position it is in without looking at it while the block is in effect.  3. For blocks in the legs and feet, returning to weight bearing and walking needs to be done carefully. You will need to wait until the numbness is entirely gone and the strength has returned. You should be able to move your leg and foot normally before you try and bear weight or walk. You will need someone to be with you when you first try to ensure you do not fall and possibly risk injury.  4. Bruising and tenderness at the needle site are common side effects and will resolve in a few days.  5. Persistent numbness or new problems with movement should be communicated to the surgeon or the [place] [phone] [place] [phone]).  Information for Discharge Teaching: EXPAREL (bupivacaine liposome injectable suspension)   Your surgeon or anesthesiologist gave you EXPAREL(bupivacaine) to help control your pain after surgery.   EXPAREL is a local anesthetic that provides pain relief by numbing the tissue around the surgical site.  EXPAREL is designed to release pain medication over time and can control pain for up to 72 hours.  Depending on how you respond to EXPAREL, you may require less pain medication during your recovery.  Possible side effects:  Temporary loss of sensation or ability to move in the area where bupivacaine  was injected.  Nausea, vomiting, constipation  Rarely, numbness and tingling in your mouth or lips, lightheadedness, or anxiety may occur.  Call your doctor right away if you think you may be experiencing any of these sensations, or if you have other questions regarding possible side effects.  Follow all other discharge instructions given to you by your surgeon or nurse.  Eat a healthy diet and drink plenty of water or other fluids.  If you return to the hospital for any reason within 96 hours following the administration of EXPAREL, it is important for health care providers to know that you have received this anesthetic. A teal colored band has been placed on your arm with the date, time and amount of EXPAREL you have received in order to alert and inform your health care providers. Please leave this armband in place for the full 96 hours following administration, and then you may remove the band.

## 2019-01-30 NOTE — Anesthesia Procedure Notes (Signed)
Procedure Name: Intubation Performed by: Kaisei Gilbo W, CRNA Pre-anesthesia Checklist: Patient identified, Emergency Drugs available, Suction available and Patient being monitored Patient Re-evaluated:Patient Re-evaluated prior to induction Oxygen Delivery Method: Circle system utilized Preoxygenation: Pre-oxygenation with 100% oxygen Induction Type: IV induction Ventilation: Mask ventilation without difficulty Laryngoscope Size: Miller and 2 Grade View: Grade I Tube type: Oral Tube size: 7.0 mm Number of attempts: 1 Airway Equipment and Method: Stylet Placement Confirmation: ETT inserted through vocal cords under direct vision,  positive ETCO2 and breath sounds checked- equal and bilateral Secured at: 22 cm Tube secured with: Tape Dental Injury: Teeth and Oropharynx as per pre-operative assessment        

## 2019-01-30 NOTE — Transfer of Care (Signed)
Immediate Anesthesia Transfer of Care Note  Patient: Darrell Gibson IV  Procedure(s) Performed: RIGHT SHOULDER ARTHROSCOPY DEBRIDEMENT WITH SUBACROMIAL DECOMPRESSION AND DISTAL CLAVICLE EXCISION BICEP TENODESIS (Right Shoulder) BICEPS TENODESIS (Right Shoulder)  Patient Location: PACU  Anesthesia Type:General and Regional  Level of Consciousness: awake, alert  and oriented  Airway & Oxygen Therapy: Patient Spontanous Breathing and Patient connected to face mask oxygen  Post-op Assessment: Report given to RN and Post -op Vital signs reviewed and stable  Post vital signs: Reviewed and stable  Last Vitals:  Vitals Value Taken Time  BP 138/92 01/30/19 0902  Temp    Pulse 82 01/30/19 0903  Resp 18 01/30/19 0903  SpO2 95 % 01/30/19 0903  Vitals shown include unvalidated device data.  Last Pain:  Vitals:   01/30/19 0629  TempSrc: Oral  PainSc: 3          Complications: No apparent anesthesia complications

## 2019-01-30 NOTE — Progress Notes (Signed)
Assisted Dr. Singer with right, ultrasound guided, interscalene  block. Side rails up, monitors on throughout procedure. See vital signs in flow sheet. Tolerated Procedure well. 

## 2019-01-30 NOTE — Op Note (Signed)
Orthopaedic Surgery Operative Note (CSN: [id number])  Rosette Reveal IV  [birth year] Date of Surgery: 01/30/2019   Diagnoses:  Right shoulder AC arthrosis, impingement, SLAP tear  Procedure: Arthroscopic extensive debridement Arthroscopic subacromial decompression Arthroscopic biceps tenodesis Arthroscopic distal clavicle excision   Operative Finding Exam under anesthesia: Full motion no limitation Articular space: No loose bodies, capsule intact, labrum with significant anterior superior fraying Chondral surfaces:Intact, no sign of chondral degeneration on the glenoid or humeral head Biceps: Type 2 slap Subscapularis: intact Superior Cuff:intact Bursal side: intact cuff, significant injection and bursitis  Successful completion of the planned procedure.  Good fixation of the biceps, cuff pristine, anterior labral fraying debrided   Post-operative plan: The patient will be non-weightbearing in a sling x4 weeks.  The patient will be discharged home.  DVT prophylaxis not indicated in ambulatory upper extremity patient without known risk factors.   Pain control with PRN pain medication preferring oral medicines.  Follow up plan will be scheduled in approximately 7 days for incision check and XR.  Post-Op Diagnosis: Same Surgeons:Primary: Hiram Gash, MD Assistants: Noemi Chapel PA-C Location: [facility] OR ROOM 6 Anesthesia: General with Exparel interscalene block Antibiotics: Ancef 2 g Tourniquet time: None Estimated Blood Loss: Minimal Complications: None Specimens: None Implants: Implant Name Type Inv. Item Serial No. Manufacturer Lot No. LRB No. Used Action  ANCHOR SUTURE FIBERTAK 1.8 - [id number] Anchor ANCHOR SUTURE FIBERTAK 1.8  [employer] [id number] Right 1 Implanted  ANCHOR SUTURE FIBERTAK 1.8 - [id number] Anchor ANCHOR SUTURE FIBERTAK 1.8  [employer] [id number] Right 1 Implanted    Indications for Surgery:   Darrell Gibson IV is a 43 y.o. male with continued shoulder pain  refractory to nonoperative measures for extended period of time.   The risks and benefits were explained at length including but not limited to continued pain, cuff failure, biceps tenodesis failure, stiffness, need for further surgery and infection.   Procedure:   Patient was correctly identified in the preoperative holding area and operative site marked.  Patient brought to OR and positioned beachchair on an [place] table ensuring that all bony prominences were padded and the head was in an appropriate location.  Anesthesia was induced and the operative shoulder was prepped and draped in the usual sterile fashion.  Timeout was called preincision.  A standard posterior viewing portal was made after localizing the portal with a spinal needle.  An anterior accessory portal was also made.  After clearing the articular space the camera was positioned in the subacromial space.  Findings above.    Extensive debridement was performed of the anterior interval tissue, labral fraying and the bursa.   Subacromial decompression: We made a lateral portal with spinal needle guidance. We then proceeded to debride bursal tissue extensively with a shaver and arthrocare device. At that point we continued to identify the borders of the acromion and identify the spur. We then carefully preserved the deltoid fascia and used a burr to convert the type 2 acromion to a Type 1 flat acromion without issue.  Biceps tenodesis: We marked the tendon and then performed a tenotomy and debridement of the stump in the articular space. We then identified the biceps tendon in its groove suprapec with the arthroscope in the lateral portal taking care to move from lateral to medial to avoid injury to the subscapularis. At that point we unroofed the tendon itself and mobilized it. An accessory anterior portal was made in line with the tendon and we grasped it  from the anterior superior portal and worked from the accessory anterior portal. Two  [place] 1.30mm knotless anchors were placed in the groove and the tendon was secured in a luggage loop style fashion with a pass of the limb of suture through the tendon using a scorpion device to avoid pull-through.  Repair was completed with good tension on the tendon.  Residual stump of the tendon was removed after being resected with a RF ablator.  Distal Clavicle resection:  The scope was placed in the subacromial space from the posterior portal.  A hemostat was placed through the anterior portal and we spread at the [facility] joint.  A burr was then inserted and 10 mm of distal clavicle was resected taking care to avoid damage to the capsule around the joint and avoiding overhanging bone posteriorly.    The incisions were closed with absorbable monocryl and steri strips.  A sterile dressing was placed along with a sling. The patient was awoken from general anesthesia and taken to the PACU in stable condition without complication.   Alfonse Alpers, PA-C, present and scrubbed throughout the case, critical for completion in a timely fashion, and for retraction, instrumentation, closure.

## 2019-01-30 NOTE — Anesthesia Procedure Notes (Signed)
Anesthesia Regional Block: Interscalene brachial plexus block   Pre-Anesthetic Checklist: ,, timeout performed, Correct Patient, Correct Site, Correct Laterality, Correct Procedure, Correct Position, site marked, Risks and benefits discussed,  Surgical consent,  Pre-op evaluation,  At surgeon's request and post-op pain management  Laterality: Left  Prep: chloraprep       Needles:  Injection technique: Single-shot  Needle Type: Echogenic Stimulator Needle     Needle Length: 5cm  Needle Gauge: 22     Additional Needles:   Narrative:  Start time: 01/30/2019 6:58 AM End time: 01/30/2019 7:08 AM Injection made incrementally with aspirations every 5 mL.  Performed by: Personally  Anesthesiologist: Duane Boston, MD  Additional Notes: Functioning IV was confirmed and monitors applied.  A 60mm 22ga echogenic arrow stimulator was used. Sterile prep and drape,hand hygiene and sterile gloves were used.Ultrasound guidance: relevant anatomy identified, needle position confirmed, local anesthetic spread visualized around nerve(s)., vascular puncture avoided.  Image printed for medical record.  Negative aspiration and negative test dose prior to incremental administration of local anesthetic. The patient tolerated the procedure well.

## 2019-01-30 NOTE — H&P (Signed)
PREOPERATIVE H&P  Chief Complaint: RIGHT SHOULDER PRIMARY OSTEOARTHRITIS , IMPINGEMENT, AND  BICIPITAL TENDINITIS  HPI: Darrell Gibson is a 43 y.o. male who presents for preoperative history and physical with a diagnosis of RIGHT SHOULDER PRIMARY OSTEOARTHRITIS , IMPINGEMENT, AND  BICIPITAL TENDINITIS. His symptoms are rated as moderate to severe, and have been worsening.  This is significantly impairing activities of daily living. He is a Engineer, civil (consulting) by trade, and has been trying to do work around his house and in the garden during [place] as his work options have been limited. He has had minimal relief with conservative treatment of injections.  Please see the clinic note for further details on this patient's care.  He has elected for surgical management.   Past Medical History:  Diagnosis Date  . Anxiety   . Asthma   . Depression   . Hidradenitis 2017  . History of MRSA infection 2017   hidradenitis  . Hypertension    Past Surgical History:  Procedure Laterality Date  . NO PAST SURGERIES     Social History   Socioeconomic History  . Marital status: Married    Spouse name: Not on file  . Number of children: Not on file  . Years of education: Not on file  . Highest education level: Not on file  Occupational History  . Not on file  Social Needs  . Financial resource strain: Not on file  . Food insecurity    Worry: Not on file    Inability: Not on file  . Transportation needs    Medical: Not on file    Non-medical: Not on file  Tobacco Use  . Smoking status: Former Smoker    Types: Cigarettes    Quit date: 11/30/2018    Years since quitting: 0.1  . Smokeless tobacco: Never Used  . Tobacco comment: started smoking again during covid, has not had any cigarettes since august  Substance and Sexual Activity  . Alcohol use: Yes    Comment: 3 beers a day 5 days a week  . Drug use: No  . Sexual activity: Not on file  Lifestyle  . Physical activity    Days per week: Not on file    Minutes per session: Not on file  . Stress: Not on file  Relationships  . Social Musician on phone: Not on file    Gets together: Not on file    Attends religious service: Not on file    Active member of club or organization: Not on file    Attends meetings of clubs or organizations: Not on file    Relationship status: Not on file  Other Topics Concern  . Not on file  Social History Narrative  . Not on file   History reviewed. No pertinent family history. Allergies  Allergen Reactions  . Ivp Dye [Iodinated Diagnostic Agents]    Prior to Admission medications   Medication Sig Start Date End Date Taking? Authorizing Provider  albuterol (VENTOLIN HFA) 108 (90 Base) MCG/ACT inhaler Inhale 2 puffs into the lungs every 6 (six) hours as needed for wheezing or shortness of breath.   Yes [provider]  bimatoprost (LUMIGAN) 0.03 % ophthalmic solution Place 1 drop into both eyes at bedtime.   Yes Alver Fisher, RN  celecoxib (CELEBREX) 50 MG capsule Take 50 mg by mouth 2 (two) times daily.   Yes Alver Fisher, RN  cetirizine (ZYRTEC) 10 MG tablet Take 10 mg by  mouth daily.   Yes Joline Salt, RN  citalopram (CELEXA) 20 MG tablet Take 20 mg by mouth daily.   Yes Joline Salt, RN  metoprolol tartrate (LOPRESSOR) 50 MG tablet Take 50 mg by mouth every morning.   Yes Joline Salt, RN  omeprazole (PRILOSEC) 20 MG capsule Take 20 mg by mouth daily.   Yes [provider]     Positive ROS: All other systems have been reviewed and were otherwise negative with the exception of those mentioned in the HPI and as above.  Physical Exam: General: Alert, no acute distress Cardiovascular: No pedal edema Respiratory: No cyanosis, no use of accessory musculature GI: No organomegaly, abdomen is soft and non-tender Skin: No lesions in the area of chief complaint Neurologic: Sensation intact distally Psychiatric: Patient is competent for consent with normal mood  and affect Lymphatic: No axillary or cervical lymphadenopathy  MUSCULOSKELETAL:  Examination of the right shoulder demonstrates active forward elevation to 170, external rotation to 60 and symmetric. Cuff strength is 5/5; positive impingement.  AC tenderness to palpation on [place].  Distal motor and sensory function intact.   Assessment: RIGHT SHOULDER PRIMARY OSTEOARTHRITIS , IMPINGEMENT, AND  BICIPITAL TENDINITIS  Plan: Plan for Procedure(s): RIGHT SHOULDER ARTHROSCOPY DEBRIDEMENT WITH SUBACROMIAL DECOMPRESSION AND DISTAL CLAVICLE EXCISION BICEP TENODESIS  The risks benefits and alternatives were discussed with the patient including but not limited to the risks of nonoperative treatment, versus surgical intervention including infection, bleeding, nerve injury,  blood clots, cardiopulmonary complications, morbidity, mortality, among others, and they were willing to proceed.  The patient wishes to proceed with surgery. He is scheduled for surgery on 01/30/19.        Maylene Roes [place], [place]  01/30/2019 6:39 AM

## 2019-01-30 NOTE — Anesthesia Postprocedure Evaluation (Signed)
Anesthesia Post Note  Patient: Darrell Gibson IV  Procedure(s) Performed: RIGHT SHOULDER ARTHROSCOPY DEBRIDEMENT WITH SUBACROMIAL DECOMPRESSION AND DISTAL CLAVICLE EXCISION BICEP TENODESIS (Right Shoulder) BICEPS TENODESIS (Right Shoulder)     Patient location during evaluation: PACU Anesthesia Type: General Level of consciousness: sedated Pain management: pain level controlled Vital Signs Assessment: post-procedure vital signs reviewed and stable Respiratory status: spontaneous breathing and respiratory function stable Cardiovascular status: stable Postop Assessment: no apparent nausea or vomiting Anesthetic complications: no    Last Vitals:  Vitals:   01/30/19 0945 01/30/19 1015  BP: (!) 147/95 (!) 143/97  Pulse: 81 85  Resp: 17 18  Temp:  36.8 C  SpO2: 96% 95%    Last Pain:  Vitals:   01/30/19 1015  TempSrc:   PainSc: 3                  Tereso Unangst DANIEL

## 2019-01-31 ENCOUNTER — Encounter (HOSPITAL_BASED_OUTPATIENT_CLINIC_OR_DEPARTMENT_OTHER): Payer: Self-pay | Admitting: Orthopaedic Surgery

## 2019-05-12 ENCOUNTER — Other Ambulatory Visit: Payer: Self-pay | Admitting: Orthopedic Surgery

## 2019-05-13 ENCOUNTER — Encounter (HOSPITAL_COMMUNITY)
Admission: RE | Admit: 2019-05-13 | Discharge: 2019-05-13 | Disposition: A | Discharge status: Home / Self Care | Payer: BLUE CROSS/BLUE SHIELD | Source: Ambulatory Visit | Attending: Orthopedic Surgery | Admitting: Orthopedic Surgery

## 2019-05-13 ENCOUNTER — Encounter (HOSPITAL_COMMUNITY): Payer: Self-pay

## 2019-05-13 ENCOUNTER — Other Ambulatory Visit (HOSPITAL_COMMUNITY)
Admission: RE | Admit: 2019-05-13 | Discharge: 2019-05-13 | Disposition: A | Discharge status: Home / Self Care | Payer: BLUE CROSS/BLUE SHIELD | Source: Ambulatory Visit | Attending: Orthopedic Surgery | Admitting: Orthopedic Surgery

## 2019-05-13 ENCOUNTER — Other Ambulatory Visit: Payer: Self-pay

## 2019-05-13 DIAGNOSIS — Z20822 Contact with and (suspected) exposure to covid-19: Secondary | ICD-10-CM | POA: Insufficient documentation

## 2019-05-13 DIAGNOSIS — Z79899 Other long term (current) drug therapy: Secondary | ICD-10-CM | POA: Diagnosis not present

## 2019-05-13 DIAGNOSIS — M50122 Cervical disc disorder at C5-C6 level with radiculopathy: Secondary | ICD-10-CM | POA: Insufficient documentation

## 2019-05-13 DIAGNOSIS — F329 Major depressive disorder, single episode, unspecified: Secondary | ICD-10-CM | POA: Insufficient documentation

## 2019-05-13 DIAGNOSIS — J45909 Unspecified asthma, uncomplicated: Secondary | ICD-10-CM | POA: Diagnosis not present

## 2019-05-13 DIAGNOSIS — F419 Anxiety disorder, unspecified: Secondary | ICD-10-CM | POA: Insufficient documentation

## 2019-05-13 DIAGNOSIS — I10 Essential (primary) hypertension: Secondary | ICD-10-CM | POA: Insufficient documentation

## 2019-05-13 DIAGNOSIS — Z01818 Encounter for other preprocedural examination: Secondary | ICD-10-CM | POA: Insufficient documentation

## 2019-05-13 DIAGNOSIS — K219 Gastro-esophageal reflux disease without esophagitis: Secondary | ICD-10-CM | POA: Diagnosis not present

## 2019-05-13 DIAGNOSIS — Z87891 Personal history of nicotine dependence: Secondary | ICD-10-CM | POA: Diagnosis not present

## 2019-05-13 HISTORY — DX: Unspecified osteoarthritis, unspecified site: M19.90

## 2019-05-13 HISTORY — DX: Presence of spectacles and contact lenses: Z97.3

## 2019-05-13 HISTORY — DX: Radiculopathy, cervical region: M54.12

## 2019-05-13 LAB — CBC WITH DIFFERENTIAL/PLATELET
Abs Immature Granulocytes: 0.1 10*3/uL — ABNORMAL HIGH (ref 0.00–0.07)
Basophils Absolute: 0.1 10*3/uL (ref 0.0–0.1)
Basophils Relative: 1 %
Eosinophils Absolute: 0.2 10*3/uL (ref 0.0–0.5)
Eosinophils Relative: 1 %
HCT: 47 % (ref 39.0–52.0)
Hemoglobin: 15.4 g/dL (ref 13.0–17.0)
Immature Granulocytes: 1 %
Lymphocytes Relative: 27 %
Lymphs Abs: 3.8 10*3/uL (ref 0.7–4.0)
MCH: 31 pg (ref 26.0–34.0)
MCHC: 32.8 g/dL (ref 30.0–36.0)
MCV: 94.8 fL (ref 80.0–100.0)
Monocytes Absolute: 0.7 10*3/uL (ref 0.1–1.0)
Monocytes Relative: 5 %
Neutro Abs: 8.8 10*3/uL — ABNORMAL HIGH (ref 1.7–7.7)
Neutrophils Relative %: 65 %
Platelets: 402 10*3/uL — ABNORMAL HIGH (ref 150–400)
RBC: 4.96 MIL/uL (ref 4.22–5.81)
RDW: 13.1 % (ref 11.5–15.5)
WBC: 13.7 10*3/uL — ABNORMAL HIGH (ref 4.0–10.5)
nRBC: 0 % (ref 0.0–0.2)

## 2019-05-13 LAB — COMPREHENSIVE METABOLIC PANEL
ALT: 63 U/L — ABNORMAL HIGH (ref 0–44)
AST: 38 U/L (ref 15–41)
Albumin: 4.1 g/dL (ref 3.5–5.0)
Alkaline Phosphatase: 66 U/L (ref 38–126)
Anion gap: 11 (ref 5–15)
BUN: 16 mg/dL (ref 6–20)
CO2: 25 mmol/L (ref 22–32)
Calcium: 9.3 mg/dL (ref 8.9–10.3)
Chloride: 102 mmol/L (ref 98–111)
Creatinine, Ser: 0.75 mg/dL (ref 0.61–1.24)
GFR calc Af Amer: >60 mL/min (ref >60)
GFR calc non Af Amer: >60 mL/min (ref >60)
Glucose, Bld: 83 mg/dL (ref 70–99)
Potassium: 3.9 mmol/L (ref 3.5–5.1)
Sodium: 138 mmol/L (ref 135–145)
Total Bilirubin: 0.4 mg/dL (ref 0.3–1.2)
Total Protein: 7.4 g/dL (ref 6.5–8.1)

## 2019-05-13 LAB — URINALYSIS, ROUTINE W REFLEX MICROSCOPIC
Bilirubin Urine: NEGATIVE
Glucose, UA: NEGATIVE mg/dL
Hgb urine dipstick: NEGATIVE
Ketones, ur: NEGATIVE mg/dL
Leukocytes,Ua: NEGATIVE
Nitrite: NEGATIVE
Protein, ur: NEGATIVE mg/dL
Specific Gravity, Urine: 1.021 (ref 1.005–1.030)
pH: 5 (ref 5.0–8.0)

## 2019-05-13 LAB — SURGICAL PCR SCREEN
MRSA, PCR: NEGATIVE
Staphylococcus aureus: POSITIVE — AB

## 2019-05-13 LAB — APTT: aPTT: 28 seconds (ref 24–36)

## 2019-05-13 LAB — PROTIME-INR
INR: 0.9 (ref 0.8–1.2)
Prothrombin Time: 12.4 seconds (ref 11.4–15.2)

## 2019-05-13 LAB — SARS CORONAVIRUS 2 (TAT 6-24 HRS): SARS Coronavirus 2: NEGATIVE

## 2019-05-13 NOTE — Progress Notes (Signed)
   05/13/19 1307  OBSTRUCTIVE SLEEP APNEA  Have you ever been diagnosed with sleep apnea through a sleep study? No  Do you snore loudly (loud enough to be heard through closed doors)?  1  Do you often feel tired, fatigued, or sleepy during the daytime (such as falling asleep during driving or talking to someone)? 0  Has anyone observed you stop breathing during your sleep? 0  Do you have, or are you being treated for high blood pressure? 1  BMI more than 35 kg/m2? 1  Age > 50 (1-yes) 0  Neck circumference greater than:Male 16 inches or larger, Male 17inches or larger? 1  Male Gender (Yes=1) 1  Obstructive Sleep Apnea Score 5

## 2019-05-13 NOTE — Progress Notes (Signed)
Pt denies SOB, chest pain, and being under the care of a cardiologist. Pt stated that he is under the care of Dr. Marguerite Olea, PCP.  Pt denies having a stress test, echo and cardiac cath. Pt denies having a chest x ray in the last year. Pt denies recent labs. Pt reminded to quarantine after COVID test. Pt verbalized understanding of all pre-op instructions.

## 2019-05-13 NOTE — Pre-Procedure Instructions (Signed)
   Darrell Gibson  05/13/2019     [id number] - [place], [place] - [address] [place] [address] Phone: [phone] Fax: [phone]   Your procedure is scheduled on Thursday, May 15, 2019  Report to [facility] Admitting at 8:15 A.M.  Call this number if you have problems the morning of surgery:  [phone]   Remember:  Do not eat or drink after midnight the night before surgery   You may drink clear liquids until 8:15A.M .  Clear liquids allowed are:                    Water, Juice (non-citric and without pulp), Carbonated beverages, Clear Tea, Black Coffee only and Gatorade Please finish your Ensure Pre-Surgery drink by 8:15A.M the morning of surgery.    Take these medicines the morning of surgery with A SIP OF WATER :  cetirizine (ZYRTEC) escitalopram (LEXAPRO)     gabapentin (NEURONTIN)     omeprazole (PRILOSEC)      metoprolol succinate (TOPROL-XL) If needed: Pain medication ( Tylenol or Hydrocodone) If needed: tiZANidine (ZANAFLEX) for muscle spasms If needed: fluticasone (FLONASE) nasal spray for allergies If needed: albuterol (VENTOLIN HFA)  Inhaler for shortness of breath or wheezing ( Bring inhaler in with you on day of surgery)  Stop taking Aspirin (unless otherwise advised by surgeon), vitamins, fish oil and herbal medications. Do not take any NSAIDs ie: Ibuprofen, Advil, Naproxen (Aleve), Motrin, BC and Goody Powder; stop now.     Do not wear jewelry.  Do not wear lotions, powders, or perfumes, or deodorant.  Do not shave 48 hours prior to surgery.  Men may shave face and neck.  Do not bring valuables to the hospital.  [facility] is not responsible for any belongings or valuables.  Contacts, dentures or bridgework may not be worn into surgery.  Leave your suitcase in the car.  After surgery it may be brought to your room.  For patients admitted to the hospital, discharge time will be determined by your treatment  team.  Patients discharged the day of surgery will not be allowed to drive home.   Special instructions: See " [facility] Preparing For Surgery"  sheet.  Please read over the following fact sheets that you were given. Pain Booklet, Coughing and Deep Breathing and Surgical Site Infection Prevention

## 2019-05-14 NOTE — Anesthesia Preprocedure Evaluation (Addendum)
Anesthesia Evaluation  Patient identified by MRN, date of birth, ID band Patient awake    Reviewed: Allergy & Precautions, NPO status , Patient's Chart, lab work & pertinent test results, reviewed documented beta blocker date and time   Airway Mallampati: II  TM Distance: >3 FB Neck ROM: Full    Dental  (+) Dental Advisory Given, Teeth Intact   Pulmonary asthma , Patient abstained from smoking., former smoker,    Pulmonary exam normal breath sounds clear to auscultation       Cardiovascular hypertension, Pt. on medications and Pt. on home beta blockers Normal cardiovascular exam Rhythm:Regular Rate:Normal     Neuro/Psych PSYCHIATRIC DISORDERS Anxiety Depression negative neurological ROS     GI/Hepatic Neg liver ROS, GERD  ,  Endo/Other  negative endocrine ROS  Renal/GU negative Renal ROS     Musculoskeletal  (+) Arthritis ,   Abdominal (+) + obese,   Peds negative pediatric ROS (+)  Hematology negative hematology ROS (+)   Anesthesia Other Findings   Reproductive/Obstetrics                             Anesthesia Physical Anesthesia Plan  ASA: II  Anesthesia Plan: General   Post-op Pain Management:    Induction: Intravenous  PONV Risk Score and Plan: 3 and Ondansetron, Dexamethasone, Treatment may vary due to age or medical condition and Midazolam  Airway Management Planned: Oral ETT  Additional Equipment: None  Intra-op Plan:   Post-operative Plan: Extubation in OR  Informed Consent: I have reviewed the patients History and Physical, chart, labs and discussed the procedure including the risks, benefits and alternatives for the proposed anesthesia with the patient or authorized representative who has indicated his/her understanding and acceptance.     Dental advisory given  Plan Discussed with: CRNA  Anesthesia Plan Comments: (PAT note written 05/14/2019 by Myra Gianotti, PA-C.] )       Anesthesia Quick Evaluation                                  Anesthesia Evaluation  Patient identified by MRN, date of birth, ID band Patient awake    Reviewed: Allergy & Precautions, NPO status , Patient's Chart, lab work & pertinent test results, reviewed documented beta blocker date and time   History of Anesthesia Complications Negative for: history of anesthetic complications  Airway Mallampati: III  TM Distance: >3 FB Neck ROM: Full    Dental no notable dental hx. (+) Dental Advisory Given   Pulmonary neg pulmonary ROS, Patient abstained from smoking., former smoker,    Pulmonary exam normal        Cardiovascular hypertension, Pt. on home beta blockers Normal cardiovascular exam     Neuro/Psych PSYCHIATRIC DISORDERS Anxiety Depression negative neurological ROS     GI/Hepatic negative GI ROS, Neg liver ROS,   Endo/Other  negative endocrine ROS  Renal/GU negative Renal ROS     Musculoskeletal negative musculoskeletal ROS (+)   Abdominal   Peds  Hematology negative hematology ROS (+)   Anesthesia Other Findings Day of surgery medications reviewed with the patient.  Reproductive/Obstetrics                            Anesthesia Physical Anesthesia Plan  ASA: II  Anesthesia Plan: General   Post-op Pain Management:  Regional for Post-op pain   Induction: Intravenous  PONV Risk Score and Plan: 2 and Ondansetron and Scopolamine patch - Pre-op  Airway Management Planned: Oral ETT  Additional Equipment:   Intra-op Plan:   Post-operative Plan: Extubation in OR  Informed Consent: I have reviewed the patients History and Physical, chart, labs and discussed the procedure including the risks, benefits and alternatives for the proposed anesthesia with the patient or authorized representative who has indicated his/her understanding and acceptance.     Dental advisory given  Plan  Discussed with: CRNA and Anesthesiologist  Anesthesia Plan Comments:        Anesthesia Quick Evaluation

## 2019-05-14 NOTE — Progress Notes (Signed)
Anesthesia Chart Review:  Case: [id number] Date/Time: 05/15/19 1102   Procedure: ANTERIOR CERVICAL DECOMPRESSION FUSION, CERVICAL 5-6 WITH INSTRUMENTATION AND ALLOGRAFT (Left )   Anesthesia type: General   Pre-op diagnosis: SEVERE LEFT-SIDED CERVICAL RADICULOPATHY , LARGE CERVICAL 5-CERVICAL 6 HERNIATION, COMPRESSING THE LEFT HEMICORD AND EXITING LEFT CERVICAL 6 NERVE   [facility] / [facility]   Surgeons: Estill Bamberg, MD      DISCUSSION: Patient is a 44 year old male scheduled for the above procedure.  History includes recent former smoker (quit 05/10/19), HTN, anxiety, depression, hidradenitis (with MRSA 2017), GERD, asthma, cervical radiculopathy. S/p right shoulder arthroscopy 01/30/19. BMI is consistent with obesity.  OSA screening score is 5. Weekly alcohol use is documented as 3 beers/day 5 days/week.  He denied chest pain or shortness of breath at PAT RN visit.  PAT BP 147/100. Reportedly took meds about ~ 45 minutes prior to visit. He left without getting blood pressure rechecked, but will get vitals on arrival the day of surgery.   BP Readings from Last 3 Encounters:  05/13/19 (!) 147/100  01/30/19 (!) 143/97  03/12/15 (!) 141/100   05/13/2019 presurgical COVID-19 test was negative.  Anesthesia team to evaluate on the day of surgery.   VS: BP (!) 147/100   Pulse (!) 104   Temp 36.9 C (Oral)   Resp 18   Ht 5\' 6"  (1.676 m)   Wt 103.2 kg   SpO2 100%   BMI 36.72 kg/m   PROVIDERS: , MD is PCP   LABS: Labs reviewed: Acceptable for surgery. (all labs ordered are listed, but only abnormal results are displayed)  Labs Reviewed  SURGICAL PCR SCREEN - Abnormal; Notable for the following components:      Result Value   Staphylococcus aureus POSITIVE (*)    All other components within normal limits  CBC WITH DIFFERENTIAL/PLATELET - Abnormal; Notable for the following components:   WBC 13.7 (*)    Platelets 402 (*)    Neutro Abs 8.8 (*)    Abs Immature  Granulocytes 0.10 (*)    All other components within normal limits  COMPREHENSIVE METABOLIC PANEL - Abnormal; Notable for the following components:   ALT 63 (*)    All other components within normal limits  APTT  PROTIME-INR  URINALYSIS, ROUTINE W REFLEX MICROSCOPIC    EKG: 01/27/19: Sinus tachycardia at 112 bpm Minimal voltage criteria for LVH, may be normal variant ( Sokolow-Lyon ) Borderline ECG Confirmed by 03/29/19 [phone]) on 01/27/2019 9:51:48 PM  CV: N/A   Past Medical History:  Diagnosis Date  . Anxiety   . Arthritis   . Asthma    daily albuterol  . Cervical radiculopathy    with disc herniation   . Depression   . GERD (gastroesophageal reflux disease)   . Hidradenitis 2017  . History of MRSA infection 2017   hidradenitis  . Hypertension   . Wears glasses   . Wears glasses     Past Surgical History:  Procedure Laterality Date  . BICEPT TENODESIS Right 01/30/2019   Procedure: BICEPS TENODESIS;  Surgeon: 02/01/2019, MD;  Location: [facility];  Service: Orthopedics;  Laterality: Right;  . NO PAST SURGERIES      MEDICATIONS: . acetaminophen (TYLENOL) 500 MG tablet  . albuterol (VENTOLIN HFA) 108 (90 Base) MCG/ACT inhaler  . cetirizine (ZYRTEC) 10 MG tablet  . escitalopram (LEXAPRO) 20 MG tablet  . fluticasone (FLONASE) 50 MCG/ACT nasal spray  .  gabapentin (NEURONTIN) 300 MG capsule  . HYDROcodone-acetaminophen (NORCO) 10-325 MG tablet  . LUMIGAN 0.01 % SOLN  . metoprolol succinate (TOPROL-XL) 50 MG 24 hr tablet  . Multiple Vitamin (MULTIVITAMIN WITH MINERALS) TABS tablet  . nicotine (NICODERM CQ - DOSED IN MG/24 HOURS) 21 mg/24hr patch  . omeprazole (PRILOSEC) 20 MG capsule  . tiZANidine (ZANAFLEX) 4 MG tablet   No current facility-administered medications for this encounter.    Myra Gianotti, PA-C Surgical Short Stay/Anesthesiology [facility] Phone [phone] [facility] Phone [phone] 05/14/2019 11:42 AM

## 2019-05-14 NOTE — Progress Notes (Signed)
Late entry  Pt stated that he took his blood pressure medication 45 minutes prior to arriving at PAT appointment. Pt left PAT without repeat BP.

## 2019-05-15 ENCOUNTER — Ambulatory Visit (HOSPITAL_COMMUNITY): Payer: BLUE CROSS/BLUE SHIELD | Admitting: Certified Registered Nurse Anesthetist

## 2019-05-15 ENCOUNTER — Encounter (HOSPITAL_COMMUNITY)
Admission: RE | Disposition: A | Discharge status: Home / Self Care | Payer: Self-pay | Source: Home / Self Care | Attending: Orthopedic Surgery

## 2019-05-15 ENCOUNTER — Ambulatory Visit (HOSPITAL_COMMUNITY): Payer: BLUE CROSS/BLUE SHIELD

## 2019-05-15 ENCOUNTER — Other Ambulatory Visit: Payer: Self-pay

## 2019-05-15 ENCOUNTER — Encounter (HOSPITAL_COMMUNITY): Payer: Self-pay | Admitting: Orthopedic Surgery

## 2019-05-15 ENCOUNTER — Observation Stay (HOSPITAL_COMMUNITY)
Admission: RE | Admit: 2019-05-15 | Discharge: 2019-05-16 | Disposition: A | Discharge status: Home / Self Care | Payer: BLUE CROSS/BLUE SHIELD | Attending: Orthopedic Surgery | Admitting: Orthopedic Surgery

## 2019-05-15 ENCOUNTER — Ambulatory Visit (HOSPITAL_COMMUNITY): Payer: BLUE CROSS/BLUE SHIELD | Admitting: Vascular Surgery

## 2019-05-15 DIAGNOSIS — M5410 Radiculopathy, site unspecified: Secondary | ICD-10-CM | POA: Diagnosis present

## 2019-05-15 DIAGNOSIS — M50122 Cervical disc disorder at C5-C6 level with radiculopathy: Principal | ICD-10-CM | POA: Insufficient documentation

## 2019-05-15 DIAGNOSIS — M4802 Spinal stenosis, cervical region: Secondary | ICD-10-CM | POA: Diagnosis not present

## 2019-05-15 DIAGNOSIS — F329 Major depressive disorder, single episode, unspecified: Secondary | ICD-10-CM | POA: Diagnosis not present

## 2019-05-15 DIAGNOSIS — J45909 Unspecified asthma, uncomplicated: Secondary | ICD-10-CM | POA: Diagnosis not present

## 2019-05-15 DIAGNOSIS — Z8249 Family history of ischemic heart disease and other diseases of the circulatory system: Secondary | ICD-10-CM | POA: Diagnosis not present

## 2019-05-15 DIAGNOSIS — I10 Essential (primary) hypertension: Secondary | ICD-10-CM | POA: Insufficient documentation

## 2019-05-15 DIAGNOSIS — M1990 Unspecified osteoarthritis, unspecified site: Secondary | ICD-10-CM | POA: Insufficient documentation

## 2019-05-15 DIAGNOSIS — G9520 Unspecified cord compression: Secondary | ICD-10-CM | POA: Insufficient documentation

## 2019-05-15 DIAGNOSIS — K219 Gastro-esophageal reflux disease without esophagitis: Secondary | ICD-10-CM | POA: Diagnosis not present

## 2019-05-15 DIAGNOSIS — Z888 Allergy status to other drugs, medicaments and biological substances status: Secondary | ICD-10-CM | POA: Insufficient documentation

## 2019-05-15 DIAGNOSIS — Z87891 Personal history of nicotine dependence: Secondary | ICD-10-CM | POA: Insufficient documentation

## 2019-05-15 DIAGNOSIS — F419 Anxiety disorder, unspecified: Secondary | ICD-10-CM | POA: Diagnosis not present

## 2019-05-15 DIAGNOSIS — Z91041 Radiographic dye allergy status: Secondary | ICD-10-CM | POA: Diagnosis not present

## 2019-05-15 DIAGNOSIS — Z419 Encounter for procedure for purposes other than remedying health state, unspecified: Secondary | ICD-10-CM

## 2019-05-15 DIAGNOSIS — Z79899 Other long term (current) drug therapy: Secondary | ICD-10-CM | POA: Insufficient documentation

## 2019-05-15 HISTORY — PX: ANTERIOR CERVICAL DECOMP/DISCECTOMY FUSION: SHX1161

## 2019-05-15 LAB — GLUCOSE, CAPILLARY: Glucose-Capillary: 167 mg/dL — ABNORMAL HIGH (ref 70–99)

## 2019-05-15 SURGERY — ANTERIOR CERVICAL DECOMPRESSION/DISCECTOMY FUSION 1 LEVEL
Anesthesia: General | Site: Spine Cervical | Laterality: Left

## 2019-05-15 MED ORDER — MENTHOL 3 MG MT LOZG: 1.0000 | LOZENGE | OROMUCOSAL | Status: DC | PRN

## 2019-05-15 MED ORDER — CEFAZOLIN SODIUM-DEXTROSE 2-4 GM/100ML-% IV SOLN
2.0000 g | INTRAVENOUS | Status: AC
  Administered 2019-05-15: 2 g via INTRAVENOUS
  Filled 2019-05-15: qty 100

## 2019-05-15 MED ORDER — METHOCARBAMOL 500 MG PO TABS
500.0000 mg | ORAL_TABLET | Freq: Four times a day (QID) | ORAL | Status: DC | PRN
  Administered 2019-05-15 – 2019-05-16 (×4): 500 mg via ORAL
  Filled 2019-05-15 (×3): qty 1

## 2019-05-15 MED ORDER — CEFAZOLIN SODIUM-DEXTROSE 2-4 GM/100ML-% IV SOLN
2.0000 g | Freq: Three times a day (TID) | INTRAVENOUS | Status: AC
  Administered 2019-05-15 – 2019-05-16 (×2): 2 g via INTRAVENOUS
  Filled 2019-05-15 (×2): qty 100

## 2019-05-15 MED ORDER — PHENOL 1.4 % MT LIQD: 1.0000 | OROMUCOSAL | Status: DC | PRN

## 2019-05-15 MED ORDER — ALBUTEROL SULFATE (2.5 MG/3ML) 0.083% IN NEBU: 2.5000 mg | INHALATION_SOLUTION | Freq: Four times a day (QID) | RESPIRATORY_TRACT | Status: DC | PRN

## 2019-05-15 MED ORDER — HYDROMORPHONE HCL 1 MG/ML IJ SOLN
0.2500 mg | INTRAMUSCULAR | Status: DC | PRN
  Administered 2019-05-15 (×4): 0.5 mg via INTRAVENOUS

## 2019-05-15 MED ORDER — FENTANYL CITRATE (PF) 250 MCG/5ML IJ SOLN
INTRAMUSCULAR | Status: AC
  Filled 2019-05-15: qty 5

## 2019-05-15 MED ORDER — DEXAMETHASONE SODIUM PHOSPHATE 10 MG/ML IJ SOLN
INTRAMUSCULAR | Status: AC
  Filled 2019-05-15: qty 1

## 2019-05-15 MED ORDER — BISACODYL 5 MG PO TBEC: 5.0000 mg | DELAYED_RELEASE_TABLET | Freq: Every day | ORAL | Status: DC | PRN

## 2019-05-15 MED ORDER — ONDANSETRON HCL 4 MG PO TABS: 4.0000 mg | ORAL_TABLET | Freq: Four times a day (QID) | ORAL | Status: DC | PRN

## 2019-05-15 MED ORDER — ACETAMINOPHEN 325 MG PO TABS: 650.0000 mg | ORAL_TABLET | ORAL | Status: DC | PRN

## 2019-05-15 MED ORDER — FLUTICASONE PROPIONATE 50 MCG/ACT NA SUSP
1.0000 | Freq: Every day | NASAL | Status: DC | PRN
  Filled 2019-05-15: qty 16

## 2019-05-15 MED ORDER — LIDOCAINE 2% (20 MG/ML) 5 ML SYRINGE
INTRAMUSCULAR | Status: DC | PRN
  Administered 2019-05-15: 100 mg via INTRAVENOUS

## 2019-05-15 MED ORDER — PROPOFOL 10 MG/ML IV BOLUS
INTRAVENOUS | Status: DC | PRN
  Administered 2019-05-15: 200 mg via INTRAVENOUS

## 2019-05-15 MED ORDER — ONDANSETRON HCL 4 MG/2ML IJ SOLN
INTRAMUSCULAR | Status: DC | PRN
  Administered 2019-05-15: 4 mg via INTRAVENOUS

## 2019-05-15 MED ORDER — PANTOPRAZOLE SODIUM 40 MG PO TBEC
40.0000 mg | DELAYED_RELEASE_TABLET | Freq: Every day | ORAL | Status: DC
  Administered 2019-05-15: 21:00:00 40 mg via ORAL
  Filled 2019-05-15: qty 1

## 2019-05-15 MED ORDER — 0.9 % SODIUM CHLORIDE (POUR BTL) OPTIME
TOPICAL | Status: DC | PRN
  Administered 2019-05-15 (×2): 1000 mL

## 2019-05-15 MED ORDER — THROMBIN 20000 UNITS EX SOLR
CUTANEOUS | Status: AC
  Filled 2019-05-15: qty 20000

## 2019-05-15 MED ORDER — PANTOPRAZOLE SODIUM 40 MG IV SOLR: 40.0000 mg | Freq: Every day | INTRAVENOUS | Status: DC

## 2019-05-15 MED ORDER — DEXAMETHASONE SODIUM PHOSPHATE 10 MG/ML IJ SOLN
INTRAMUSCULAR | Status: DC | PRN
  Administered 2019-05-15: 10 mg via INTRAVENOUS

## 2019-05-15 MED ORDER — ALBUTEROL SULFATE HFA 108 (90 BASE) MCG/ACT IN AERS: 2.0000 | INHALATION_SPRAY | Freq: Four times a day (QID) | RESPIRATORY_TRACT | Status: DC | PRN

## 2019-05-15 MED ORDER — POVIDONE-IODINE 7.5 % EX SOLN: Freq: Once | CUTANEOUS | Status: DC

## 2019-05-15 MED ORDER — FENTANYL CITRATE (PF) 100 MCG/2ML IJ SOLN
INTRAMUSCULAR | Status: DC | PRN
  Administered 2019-05-15: 50 ug via INTRAVENOUS
  Administered 2019-05-15: 100 ug via INTRAVENOUS
  Administered 2019-05-15: 50 ug via INTRAVENOUS
  Administered 2019-05-15: 150 ug via INTRAVENOUS

## 2019-05-15 MED ORDER — ROCURONIUM BROMIDE 50 MG/5ML IV SOSY
PREFILLED_SYRINGE | INTRAVENOUS | Status: DC | PRN
  Administered 2019-05-15: 40 mg via INTRAVENOUS
  Administered 2019-05-15: 60 mg via INTRAVENOUS

## 2019-05-15 MED ORDER — BUPIVACAINE-EPINEPHRINE (PF) 0.25% -1:200000 IJ SOLN
INTRAMUSCULAR | Status: AC
  Filled 2019-05-15: qty 30

## 2019-05-15 MED ORDER — GABAPENTIN 300 MG PO CAPS
300.0000 mg | ORAL_CAPSULE | Freq: Two times a day (BID) | ORAL | Status: DC
  Administered 2019-05-15 (×2): 300 mg via ORAL
  Filled 2019-05-15 (×2): qty 1

## 2019-05-15 MED ORDER — NICOTINE 21 MG/24HR TD PT24: 21.0000 mg | MEDICATED_PATCH | Freq: Every day | TRANSDERMAL | Status: DC

## 2019-05-15 MED ORDER — METOPROLOL SUCCINATE ER 50 MG PO TB24: 50.0000 mg | ORAL_TABLET | Freq: Every day | ORAL | Status: DC

## 2019-05-15 MED ORDER — OXYCODONE HCL 5 MG PO TABS: 5.0000 mg | ORAL_TABLET | Freq: Once | ORAL | Status: DC | PRN

## 2019-05-15 MED ORDER — SENNOSIDES-DOCUSATE SODIUM 8.6-50 MG PO TABS
1.0000 | ORAL_TABLET | Freq: Every evening | ORAL | Status: DC | PRN
  Administered 2019-05-15: 1 via ORAL
  Filled 2019-05-15: qty 1

## 2019-05-15 MED ORDER — OXYCODONE HCL 5 MG/5ML PO SOLN: 5.0000 mg | Freq: Once | ORAL | Status: DC | PRN

## 2019-05-15 MED ORDER — HYDROMORPHONE HCL 1 MG/ML IJ SOLN
INTRAMUSCULAR | Status: AC
  Filled 2019-05-15: qty 1

## 2019-05-15 MED ORDER — METHOCARBAMOL 500 MG PO TABS
ORAL_TABLET | ORAL | Status: AC
  Filled 2019-05-15: qty 1

## 2019-05-15 MED ORDER — KETAMINE HCL 50 MG/5ML IJ SOSY
PREFILLED_SYRINGE | INTRAMUSCULAR | Status: AC
  Filled 2019-05-15: qty 5

## 2019-05-15 MED ORDER — LORATADINE 10 MG PO TABS: 10.0000 mg | ORAL_TABLET | Freq: Every day | ORAL | Status: DC

## 2019-05-15 MED ORDER — MEPERIDINE HCL 25 MG/ML IJ SOLN: 6.2500 mg | INTRAMUSCULAR | Status: DC | PRN

## 2019-05-15 MED ORDER — ONDANSETRON HCL 4 MG/2ML IJ SOLN: 4.0000 mg | Freq: Four times a day (QID) | INTRAMUSCULAR | Status: DC | PRN

## 2019-05-15 MED ORDER — OXYCODONE-ACETAMINOPHEN 5-325 MG PO TABS
1.0000 | ORAL_TABLET | ORAL | Status: DC | PRN
  Administered 2019-05-15 – 2019-05-16 (×5): 2 via ORAL
  Filled 2019-05-15 (×5): qty 2

## 2019-05-15 MED ORDER — ALUM & MAG HYDROXIDE-SIMETH 200-200-20 MG/5ML PO SUSP: 30.0000 mL | Freq: Four times a day (QID) | ORAL | Status: DC | PRN

## 2019-05-15 MED ORDER — ROCURONIUM BROMIDE 10 MG/ML (PF) SYRINGE
PREFILLED_SYRINGE | INTRAVENOUS | Status: AC
  Filled 2019-05-15: qty 10

## 2019-05-15 MED ORDER — FLEET ENEMA 7-19 GM/118ML RE ENEM: 1.0000 | ENEMA | Freq: Once | RECTAL | Status: DC | PRN

## 2019-05-15 MED ORDER — SUGAMMADEX SODIUM 200 MG/2ML IV SOLN
INTRAVENOUS | Status: DC | PRN
  Administered 2019-05-15: 200 mg via INTRAVENOUS

## 2019-05-15 MED ORDER — BUPIVACAINE-EPINEPHRINE 0.25% -1:200000 IJ SOLN
INTRAMUSCULAR | Status: DC | PRN
  Administered 2019-05-15: 4 mL

## 2019-05-15 MED ORDER — CEFAZOLIN SODIUM 1 G IJ SOLR
INTRAMUSCULAR | Status: AC
  Filled 2019-05-15: qty 20

## 2019-05-15 MED ORDER — SCOPOLAMINE 1 MG/3DAYS TD PT72
MEDICATED_PATCH | TRANSDERMAL | Status: DC | PRN
  Administered 2019-05-15: 1 via TRANSDERMAL

## 2019-05-15 MED ORDER — ONDANSETRON HCL 4 MG/2ML IJ SOLN
INTRAMUSCULAR | Status: AC
  Filled 2019-05-15: qty 2

## 2019-05-15 MED ORDER — SODIUM CHLORIDE 0.9% FLUSH
3.0000 mL | Freq: Two times a day (BID) | INTRAVENOUS | Status: DC
  Administered 2019-05-15: 21:00:00 3 mL via INTRAVENOUS

## 2019-05-15 MED ORDER — ZOLPIDEM TARTRATE 5 MG PO TABS: 5.0000 mg | ORAL_TABLET | Freq: Every evening | ORAL | Status: DC | PRN

## 2019-05-15 MED ORDER — MIDAZOLAM HCL 5 MG/5ML IJ SOLN
INTRAMUSCULAR | Status: DC | PRN
  Administered 2019-05-15: 2 mg via INTRAVENOUS

## 2019-05-15 MED ORDER — PROMETHAZINE HCL 25 MG/ML IJ SOLN: 6.2500 mg | INTRAMUSCULAR | Status: DC | PRN

## 2019-05-15 MED ORDER — SODIUM CHLORIDE 0.9 % IV SOLN: 250.0000 mL | INTRAVENOUS | Status: DC

## 2019-05-15 MED ORDER — PANTOPRAZOLE SODIUM 40 MG PO TBEC: 40.0000 mg | DELAYED_RELEASE_TABLET | Freq: Every day | ORAL | Status: DC

## 2019-05-15 MED ORDER — ESCITALOPRAM OXALATE 20 MG PO TABS
20.0000 mg | ORAL_TABLET | Freq: Every day | ORAL | Status: DC
  Filled 2019-05-15: qty 1

## 2019-05-15 MED ORDER — THROMBIN 20000 UNITS EX SOLR
CUTANEOUS | Status: DC | PRN
  Administered 2019-05-15: 20 mL via TOPICAL

## 2019-05-15 MED ORDER — SCOPOLAMINE 1 MG/3DAYS TD PT72
MEDICATED_PATCH | TRANSDERMAL | Status: AC
  Filled 2019-05-15: qty 1

## 2019-05-15 MED ORDER — PHENYLEPHRINE 40 MCG/ML (10ML) SYRINGE FOR IV PUSH (FOR BLOOD PRESSURE SUPPORT)
PREFILLED_SYRINGE | INTRAVENOUS | Status: AC
  Filled 2019-05-15: qty 20

## 2019-05-15 MED ORDER — METHOCARBAMOL 1000 MG/10ML IJ SOLN
500.0000 mg | Freq: Four times a day (QID) | INTRAVENOUS | Status: DC | PRN
  Filled 2019-05-15: qty 5

## 2019-05-15 MED ORDER — SODIUM CHLORIDE 0.9% FLUSH: 3.0000 mL | INTRAVENOUS | Status: DC | PRN

## 2019-05-15 MED ORDER — ACETAMINOPHEN 650 MG RE SUPP: 650.0000 mg | RECTAL | Status: DC | PRN

## 2019-05-15 MED ORDER — LATANOPROST 0.005 % OP SOLN
1.0000 [drp] | Freq: Every day | OPHTHALMIC | Status: DC
  Filled 2019-05-15: qty 2.5

## 2019-05-15 MED ORDER — PROPOFOL 10 MG/ML IV BOLUS
INTRAVENOUS | Status: AC
  Filled 2019-05-15: qty 20

## 2019-05-15 MED ORDER — LACTATED RINGERS IV SOLN: INTRAVENOUS | Status: DC

## 2019-05-15 MED ORDER — MIDAZOLAM HCL 2 MG/2ML IJ SOLN
INTRAMUSCULAR | Status: AC
  Filled 2019-05-15: qty 2

## 2019-05-15 MED ORDER — LIDOCAINE 2% (20 MG/ML) 5 ML SYRINGE
INTRAMUSCULAR | Status: AC
  Filled 2019-05-15: qty 5

## 2019-05-15 MED ORDER — KETAMINE HCL 10 MG/ML IJ SOLN
INTRAMUSCULAR | Status: DC | PRN
  Administered 2019-05-15: 50 mg via INTRAVENOUS

## 2019-05-15 SURGICAL SUPPLY — 78 items
BENZOIN TINCTURE PRP APPL 2/3 (GAUZE/BANDAGES/DRESSINGS) ×3 IMPLANT
BIT DRILL NEURO 2X3.1 SFT TUCH (MISCELLANEOUS) ×1 IMPLANT
BIT DRILL SRG 14X2.2XFLT CHK (BIT) ×1 IMPLANT
BIT DRL SRG 14X2.2XFLT CHK (BIT) ×1
BLADE CLIPPER SURG (BLADE) ×3 IMPLANT
BLADE SURG 15 STRL LF DISP TIS (BLADE) ×1 IMPLANT
BLADE SURG 15 STRL SS (BLADE) ×2
BONE VIVIGEN FORMABLE 1.3CC (Bone Implant) ×3 IMPLANT
BUR MATCHSTICK NEURO 3.0 LAGG (BURR) IMPLANT
CARTRIDGE OIL MAESTRO DRILL (MISCELLANEOUS) ×1 IMPLANT
CLOSURE WOUND 1/2 X4 (GAUZE/BANDAGES/DRESSINGS) ×1
CORD BIPOLAR FORCEPS 12FT (ELECTRODE) ×3 IMPLANT
COVER SURGICAL LIGHT HANDLE (MISCELLANEOUS) IMPLANT
COVER WAND RF STERILE (DRAPES) ×3 IMPLANT
DIFFUSER DRILL AIR PNEUMATIC (MISCELLANEOUS) ×3 IMPLANT
DRAIN JACKSON RD 7FR 3/32 (WOUND CARE) IMPLANT
DRAPE C-ARM 42X72 X-RAY (DRAPES) ×6 IMPLANT
DRAPE POUCH INSTRU U-SHP 10X18 (DRAPES) ×3 IMPLANT
DRAPE SURG 17X23 STRL (DRAPES) ×9 IMPLANT
DRILL BIT SKYLINE 14MM (BIT) ×2
DRILL NEURO 2X3.1 SOFT TOUCH (MISCELLANEOUS) ×3
DURAPREP 26ML APPLICATOR (WOUND CARE) ×3 IMPLANT
ELECT COATED BLADE 2.86 ST (ELECTRODE) ×3 IMPLANT
ELECT REM PT RETURN 9FT ADLT (ELECTROSURGICAL) ×3
ELECTRODE REM PT RTRN 9FT ADLT (ELECTROSURGICAL) ×1 IMPLANT
EVACUATOR SILICONE 100CC (DRAIN) IMPLANT
GAUZE 4X4 16PLY RFD (DISPOSABLE) IMPLANT
GAUZE SPONGE 4X4 12PLY STRL (GAUZE/BANDAGES/DRESSINGS) IMPLANT
GAUZE SPONGE 4X4 12PLY STRL LF (GAUZE/BANDAGES/DRESSINGS) ×3 IMPLANT
GLOVE BIO SURGEON STRL SZ 6.5 (GLOVE) ×6 IMPLANT
GLOVE BIO SURGEON STRL SZ7 (GLOVE) ×3 IMPLANT
GLOVE BIO SURGEON STRL SZ8 (GLOVE) ×3 IMPLANT
GLOVE BIO SURGEONS STRL SZ 6.5 (GLOVE) ×3
GLOVE BIOGEL PI IND STRL 6.5 (GLOVE) ×1 IMPLANT
GLOVE BIOGEL PI IND STRL 7.0 (GLOVE) ×3 IMPLANT
GLOVE BIOGEL PI IND STRL 8 (GLOVE) ×1 IMPLANT
GLOVE BIOGEL PI INDICATOR 6.5 (GLOVE) ×2
GLOVE BIOGEL PI INDICATOR 7.0 (GLOVE) ×6
GLOVE BIOGEL PI INDICATOR 8 (GLOVE) ×2
GOWN STRL REUS W/ TWL LRG LVL3 (GOWN DISPOSABLE) ×3 IMPLANT
GOWN STRL REUS W/ TWL XL LVL3 (GOWN DISPOSABLE) ×1 IMPLANT
GOWN STRL REUS W/TWL LRG LVL3 (GOWN DISPOSABLE) ×6
GOWN STRL REUS W/TWL XL LVL3 (GOWN DISPOSABLE) ×2
INTERLOCK LRDTC CRVCL VBR 6MM (Bone Implant) ×1 IMPLANT
IV CATH 14GX2 1/4 (CATHETERS) ×3 IMPLANT
KIT BASIN OR (CUSTOM PROCEDURE TRAY) ×3 IMPLANT
KIT TURNOVER KIT B (KITS) ×3 IMPLANT
LORDOTIC CERVICAL VBR 6MM SM (Bone Implant) ×3 IMPLANT
MANIFOLD NEPTUNE II (INSTRUMENTS) IMPLANT
NEEDLE PRECISIONGLIDE 27X1.5 (NEEDLE) ×3 IMPLANT
NEEDLE SPNL 20GX3.5 QUINCKE YW (NEEDLE) ×3 IMPLANT
NS IRRIG 1000ML POUR BTL (IV SOLUTION) ×6 IMPLANT
OIL CARTRIDGE MAESTRO DRILL (MISCELLANEOUS) ×3
PACK ORTHO CERVICAL (CUSTOM PROCEDURE TRAY) ×3 IMPLANT
PAD ARMBOARD 7.5X6 YLW CONV (MISCELLANEOUS) ×6 IMPLANT
PATTIES SURGICAL .5 X.5 (GAUZE/BANDAGES/DRESSINGS) ×3 IMPLANT
PATTIES SURGICAL .5 X1 (DISPOSABLE) IMPLANT
PIN DISTRACTION 14 (PIN) ×6 IMPLANT
PLATE SKYLINE 12MM (Plate) ×3 IMPLANT
POSITIONER HEAD DONUT 9IN (MISCELLANEOUS) ×3 IMPLANT
SCREW SKYLINE VAR OS 14MM (Screw) ×12 IMPLANT
SPONGE INTESTINAL PEANUT (DISPOSABLE) ×6 IMPLANT
SPONGE SURGIFOAM ABS GEL 100 (HEMOSTASIS) ×3 IMPLANT
STRIP CLOSURE SKIN 1/2X4 (GAUZE/BANDAGES/DRESSINGS) ×2 IMPLANT
SURGIFLO W/THROMBIN 8M KIT (HEMOSTASIS) IMPLANT
SUT MNCRL AB 4-0 PS2 18 (SUTURE) IMPLANT
SUT SILK 4 0 (SUTURE)
SUT SILK 4-0 18XBRD TIE 12 (SUTURE) IMPLANT
SUT VIC AB 2-0 CT2 18 VCP726D (SUTURE) ×6 IMPLANT
SYR BULB IRRIGATION 50ML (SYRINGE) ×3 IMPLANT
SYR CONTROL 10ML LL (SYRINGE) ×6 IMPLANT
TAPE CLOTH 4X10 WHT NS (GAUZE/BANDAGES/DRESSINGS) ×3 IMPLANT
TAPE CLOTH SURG 4X10 WHT LF (GAUZE/BANDAGES/DRESSINGS) ×3 IMPLANT
TAPE UMBILICAL COTTON 1/8X30 (MISCELLANEOUS) ×3 IMPLANT
TOWEL GREEN STERILE (TOWEL DISPOSABLE) ×3 IMPLANT
TOWEL GREEN STERILE FF (TOWEL DISPOSABLE) ×3 IMPLANT
WATER STERILE IRR 1000ML POUR (IV SOLUTION) ×3 IMPLANT
YANKAUER SUCT BULB TIP NO VENT (SUCTIONS) ×3 IMPLANT

## 2019-05-15 NOTE — Op Note (Signed)
Gibson NAME: Darrell Gibson   MEDICAL RECORD NO.:   [id number]    DATE OF BIRTH: 09/17/1975   DATE OF PROCEDURE: 05/15/2019                               OPERATIVE REPORT     PREOPERATIVE DIAGNOSES: 1. Left-sided cervical radiculopathy, manifesting as progressive left arm weakness and pain. 2. 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 Left-sided cervical radiculopathy, manifesting as progressive left arm weakness and pain. 2. Severe C5/6 spinal stenosis, including nerve root compression and spinal cord compression   PROCEDURE: 1. Anterior cervical decompression and fusion C5/6. 2. Placement of anterior instrumentation, C5/6. 3. Insertion of interbody device x 1 (71mm Titan intervertebral spacer). 4. Intraoperative use of fluoroscopy. 5. Use of morselized allograft - ViviGen.   SURGEON:  Phylliss Bob, MD   ASSISTANT:  Pricilla Holm, PA-C.   ANESTHESIA:  General endotracheal anesthesia.   COMPLICATIONS:  None.   DISPOSITION:  Stable.   ESTIMATED BLOOD LOSS:  Minimal.   INDICATIONS FOR SURGERY:  Briefly, Darrell Gibson  is a pleasant 44 -year- old male, who did present to me with severe pain in his neck and left arm.  His pain had been progressive, and he was noted to have progressive weakness as well. Darrell Gibson's MRI did reveal Darrell prominent findings noted above.  Given his pain, and progressive weakness, we did discuss proceeding with Darrell procedure noted above.  Darrell Gibson was fully aware of Darrell risks and limitations of surgery as outlined in my preoperative note.   OPERATIVE DETAILS:  On 05/15/2019 , Darrell Gibson was brought to surgery and general endotracheal anesthesia was administered.  Darrell Gibson was placed supine on Darrell hospital bed. Darrell neck was gently extended.  All bony prominences were meticulously padded.  Darrell neck was prepped and draped in Darrell usual sterile fashion.  At this point, I  did make a left-sided transverse incision.  Darrell platysma was incised.  A Smith-Robinson approach was used and Darrell anterior spine was identified. A self-retaining retractor was placed.  I then subperiosteally exposed Darrell vertebral bodies from C5/6.  Caspar pins were then placed into Darrell C5 and C6 vertebral bodies and distraction was applied.  A thorough and complete C5-6 intervertebral diskectomy was performed.  Darrell posterior longitudinal ligament was identified and entered using a nerve hook.  I then used #1 followed by #2 Kerrison to perform a thorough and complete intervertebral diskectomy.  Darrell spinal canal was thoroughly decompressed, as was Darrell left and right neuroforamen.  Of note, a very large, prominent herniated disc fragment was located in Darrell region of Darrell left foramen, which was partially compressing Darrell spinal cord as well.  This was removed uneventfully. Darrell endplates were then prepared and Darrell appropriate-sized intervertebral spacer was then packed with ViviGen and tamped into position in Darrell usual fashion. Darrell Caspar pins  then were removed and bone wax was placed in their place.  Darrell appropriate-sized anterior cervical plate was placed over Darrell anterior spine.  14 mm variable angle screws were placed, 2 in each vertebral body from C5-C6 for a total of 4 vertebral body screws.  Darrell screws were then locked to Darrell plate using Darrell cam locking mechanism.  I was very pleased with Darrell final fluoroscopic images.  Darrell wound was then irrigated.  Darrell wound was then explored for  any undue bleeding and there was no bleeding noted. Darrell wound was then closed in layers using 2-0 Vicryl, followed by 4-0 Monocryl.  Benzoin and Steri-Strips were applied, followed by sterile dressing.  All instrument counts were correct at Darrell termination of Darrell procedure.   Of note, Jason Coop, PA-C, was my assistant throughout surgery, and did aid in retraction, suctioning, and of Darrell hardware, and  closure from start to finish.     Estill Bamberg, MD

## 2019-05-15 NOTE — Transfer of Care (Signed)
Immediate Anesthesia Transfer of Care Note  Patient: Darrell Gibson IV  Procedure(s) Performed: ANTERIOR CERVICAL DECOMPRESSION FUSION, CERVICAL FIVE-SIX WITH INSTRUMENTATION AND ALLOGRAFT (Left Spine Cervical)  Patient Location: PACU  Anesthesia Type:General  Level of Consciousness: awake and alert   Airway & Oxygen Therapy: Patient Spontanous Breathing  Post-op Assessment: Report given to RN, Post -op Vital signs reviewed and stable and Patient moving all extremities  Post vital signs: Reviewed and stable  Last Vitals:  Vitals Value Taken Time  BP 149/100 05/15/19 1450  Temp 37.2 C 05/15/19 1450  Pulse 78 05/15/19 1450  Resp 20 05/15/19 1450  SpO2 98 % 05/15/19 1450    Last Pain:  Vitals:   05/15/19 1540  TempSrc:   PainSc: 7          Complications: No apparent anesthesia complications

## 2019-05-15 NOTE — H&P (Signed)
PREOPERATIVE H&P  Chief Complaint: Left arm pain  HPI: Darrell Gibson is a 44 y.o. male who presents with ongoing pain in the left arm. The patient's pain has become severe and debilitating.  MRI reveals a large C5/6 disc herniation, clearly compressing the left C6 nerve and spinal cord. He is also noted to have progressive weakness.   Patient has failed multiple forms of conservative care and continues to have pain (see office notes for additional details regarding the patient's full course of treatment)  Past Medical History:  Diagnosis Date  . Anxiety   . Arthritis   . Asthma    daily albuterol  . Cervical radiculopathy    with disc herniation   . Depression   . GERD (gastroesophageal reflux disease)   . Hidradenitis 2017  . History of MRSA infection 2017   hidradenitis  . Hypertension   . Wears glasses   . Wears glasses    Past Surgical History:  Procedure Laterality Date  . BICEPT TENODESIS Right 01/30/2019   Procedure: BICEPS TENODESIS;  Surgeon: Bjorn Pippin, MD;  Location: [facility];  Service: Orthopedics;  Laterality: Right;  . NO PAST SURGERIES     Social History   Socioeconomic History  . Marital status: Married    Spouse name: Not on file  . Number of children: Not on file  . Years of education: Not on file  . Highest education level: Not on file  Occupational History  . Not on file  Tobacco Use  . Smoking status: Former Smoker    Types: Cigarettes    Quit date: 05/10/2019    Years since quitting: 0.0  . Smokeless tobacco: Never Used  Substance and Sexual Activity  . Alcohol use: Yes    Comment: 3 beers a day 5 days a week  . Drug use: No    Comment: CBD   . Sexual activity: Yes  Other Topics Concern  . Not on file  Social History Narrative  . Not on file   Social Determinants of Health   Financial Resource Strain:   . Difficulty of Paying Living Expenses: Not on file  Food Insecurity:   . Worried About Community education officer in the Last Year: Not on file  . Ran Out of Food in the Last Year: Not on file  Transportation Needs:   . Lack of Transportation (Medical): Not on file  . Lack of Transportation (Non-Medical): Not on file  Physical Activity:   . Days of Exercise per Week: Not on file  . Minutes of Exercise per Session: Not on file  Stress:   . Feeling of Stress : Not on file  Social Connections:   . Frequency of Communication with Friends and Family: Not on file  . Frequency of Social Gatherings with Friends and Family: Not on file  . Attends Religious Services: Not on file  . Active Member of Clubs or Organizations: Not on file  . Attends Banker Meetings: Not on file  . Marital Status: Not on file   Family History  Problem Relation Age of Onset  . COPD Mother   . Heart failure Mother   . Diabetes Mother    Allergies  Allergen Reactions  . Ivp Dye [Iodinated Diagnostic Agents] Anaphylaxis  . Diclofenac Itching and Rash   Prior to Admission medications   Medication Sig Start Date End Date Taking? Authorizing Provider  acetaminophen (TYLENOL) 500 MG tablet Take  500 mg by mouth every 6 (six) hours as needed for moderate pain.   Yes [provider]  albuterol (VENTOLIN HFA) 108 (90 Base) MCG/ACT inhaler Inhale 2 puffs into the lungs every 6 (six) hours as needed for wheezing or shortness of breath.   Yes [provider]  cetirizine (ZYRTEC) 10 MG tablet Take 10 mg by mouth daily.   Yes Joline Salt, RN  escitalopram (LEXAPRO) 20 MG tablet Take 20 mg by mouth daily.   Yes [provider]  fluticasone (FLONASE) 50 MCG/ACT nasal spray Place 1 spray into both nostrils daily as needed for allergies or rhinitis.   Yes [provider]  gabapentin (NEURONTIN) 300 MG capsule Take 300 mg by mouth 2 (two) times daily.   Yes [provider]  HYDROcodone-acetaminophen (NORCO) 10-325 MG tablet Take 1 tablet by mouth every 6 (six) hours as  needed for moderate pain.   Yes [provider]  LUMIGAN 0.01 % SOLN Place 1 drop into both eyes at bedtime. 05/06/19  Yes [provider]  metoprolol succinate (TOPROL-XL) 50 MG 24 hr tablet Take 50 mg by mouth daily. Take with or immediately following a meal.   Yes [provider]  Multiple Vitamin (MULTIVITAMIN WITH MINERALS) TABS tablet Take 1 tablet by mouth daily.   Yes [provider]  nicotine (NICODERM CQ - DOSED IN MG/24 HOURS) 21 mg/24hr patch Place 21 mg onto the skin daily.   Yes [provider]  omeprazole (PRILOSEC) 20 MG capsule Take 20 mg by mouth daily.   Yes [provider]  tiZANidine (ZANAFLEX) 4 MG tablet Take 4 mg by mouth 3 (three) times daily as needed for muscle spasms.   Yes [provider]     All other systems have been reviewed and were otherwise negative with the exception of those mentioned in the HPI and as above.  Physical Exam: There were no vitals filed for this visit.  There is no height or weight on file to calculate BMI.  General: Alert, no acute distress Cardiovascular: No pedal edema Respiratory: No cyanosis, no use of accessory musculature Skin: No lesions in the area of chief complaint Neurologic: Sensation intact distally Psychiatric: Patient is competent for consent with normal mood and affect Lymphatic: No axillary or cervical lymphadenopathy  MUSCULOSKELETAL: + spurling sign on the left. Patient lack's a biceps reflex and is noted to have weakness to wrist extension at 4/5.   Assessment/Plan: SEVERE LEFT-SIDED CERVICAL RADICULOPATHY , LARGE CERVICAL 5-CERVICAL 6 HERNIATION, COMPRESSING THE LEFT HEMICORD AND EXITING LEFT CERVICAL 6 NERVE Plan for Procedure(s): ANTERIOR CERVICAL DECOMPRESSION FUSION, CERVICAL 5-6 WITH INSTRUMENTATION AND ALLOGRAFT  Of note, this patient's surgery is being performed at a time when only urgent cases are to be performed at this hospital due to the  pandemic. Due to the patient's progressive pain and weakness, severe nerve compression, and spinal cord compression clearly noted on his MRI, I do feel that the potential for permanent neurologic deficit would be present if there were to be additional undue delay of his surgery.   Norva Karvonen, MD 05/15/2019 7:20 AM

## 2019-05-15 NOTE — Anesthesia Postprocedure Evaluation (Signed)
Anesthesia Post Note  Patient: Darrell Gibson IV  Procedure(s) Performed: ANTERIOR CERVICAL DECOMPRESSION FUSION, CERVICAL FIVE-SIX WITH INSTRUMENTATION AND ALLOGRAFT (Left Spine Cervical)     Patient location during evaluation: PACU Anesthesia Type: General Level of consciousness: sedated and patient cooperative Pain management: pain level controlled Vital Signs Assessment: post-procedure vital signs reviewed and stable Respiratory status: spontaneous breathing Cardiovascular status: stable Anesthetic complications: no    Last Vitals:  Vitals:   05/15/19 1636 05/15/19 1948  BP: (!) 155/86 (!) 150/95  Pulse: 96 82  Resp: 16 18  Temp: 37.1 C 36.7 C  SpO2: 95% 96%    Last Pain:  Vitals:   05/15/19 2046  TempSrc:   PainSc: 4                  Lewie Loron

## 2019-05-16 ENCOUNTER — Encounter: Payer: Self-pay | Admitting: *Deleted

## 2019-05-16 DIAGNOSIS — M50122 Cervical disc disorder at C5-C6 level with radiculopathy: Secondary | ICD-10-CM | POA: Diagnosis not present

## 2019-05-16 MED ORDER — METHOCARBAMOL 500 MG PO TABS: 500.0000 mg | ORAL_TABLET | Freq: Four times a day (QID) | ORAL | 2 refills | Status: DC | PRN

## 2019-05-16 MED ORDER — OXYCODONE-ACETAMINOPHEN 5-325 MG PO TABS: 1.0000 | ORAL_TABLET | ORAL | 0 refills | Status: DC | PRN

## 2019-05-16 NOTE — Progress Notes (Signed)
    Patient doing well  Denies arm pain Tolerating PO well   Physical Exam: Vitals:   05/15/19 2324 05/16/19 0350  BP: (!) 150/99 (!) 149/102  Pulse: 90 91  Resp: 20 18  Temp: 99.1 F (37.3 C) 98.5 F (36.9 C)  SpO2: 94% 96%    Neck soft/supple Dressing in place NVI  POD #1 s/p ACDF, doing well  - encourage ambulation - Percocet for pain, Robaxin for muscle spasms - d/c home today with f/u in 2 weeks

## 2019-05-16 NOTE — Progress Notes (Signed)
Patient is discharged from room 3C03 at this time. Alert and in stable condition. IV site d/c'd and instructions read to patient with understanding verbalized. Ambulate out of unit with all belongings at side.

## 2019-05-22 NOTE — Discharge Summary (Signed)
Patient ID: Darrell Gibson MRN: [id number] DOB/AGE: June 22, 1975 44 y.o.  Admit date: 05/15/2019 Discharge date: 05/16/2019  Admission Diagnoses:  Active Problems:   Radiculopathy   Discharge Diagnoses:  Same  Past Medical History:  Diagnosis Date  . Anxiety   . Arthritis   . Asthma    daily albuterol  . Cervical radiculopathy    with disc herniation   . Depression   . GERD (gastroesophageal reflux disease)   . Hidradenitis 2017  . History of MRSA infection 2017   hidradenitis  . Hypertension   . Wears glasses   . Wears glasses     Surgeries: Procedure(s): ANTERIOR CERVICAL DECOMPRESSION FUSION, CERVICAL FIVE-SIX WITH INSTRUMENTATION AND ALLOGRAFT on 05/15/2019   Consultants: None  Discharged Condition: Improved  Hospital Course: Darrell Gibson is an 44 y.o. male who was admitted 05/15/2019 for operative treatment of radiculopathy. Patient has severe unremitting pain that affects sleep, daily activities, and work/hobbies. After pre-op clearance the patient was taken to the operating room on 05/15/2019 and underwent  Procedure(s): ANTERIOR CERVICAL DECOMPRESSION FUSION, CERVICAL FIVE-SIX WITH INSTRUMENTATION AND ALLOGRAFT.    Patient was given perioperative antibiotics:  Anti-infectives (From admission, onward)   Start     Dose/Rate Route Frequency Ordered Stop   05/15/19 1900  ceFAZolin (ANCEF) IVPB 2g/100 mL premix     2 g 200 mL/hr over 30 Minutes Intravenous Every 8 hours 05/15/19 1504 05/16/19 0412   05/15/19 0830  ceFAZolin (ANCEF) IVPB 2g/100 mL premix     2 g 200 mL/hr over 30 Minutes Intravenous On call to O.R. 05/15/19 0240 05/15/19 1111       Patient was given sequential compression devices, early ambulation to prevent DVT.  Patient benefited maximally from hospital stay and there were no complications.    Recent vital signs: BP (!) 144/97 (BP Location: Right Arm)   Pulse 92   Temp 99 F (37.2 C) (Oral)   Resp 16   Ht 5\' 6"  (1.676 m)   Wt  102.1 kg   SpO2 96%   BMI 36.32 kg/m     Discharge Medications:   Allergies as of 05/16/2019      Reactions   Ivp Dye [iodinated Diagnostic Agents] Anaphylaxis   Diclofenac Itching, Rash      Medication List    TAKE these medications   acetaminophen 500 MG tablet Commonly known as: TYLENOL Take 500 mg by mouth every 6 (six) hours as needed for moderate pain.   albuterol 108 (90 Base) MCG/ACT inhaler Commonly known as: VENTOLIN HFA Inhale 2 puffs into the lungs every 6 (six) hours as needed for wheezing or shortness of breath.   cetirizine 10 MG tablet Commonly known as: ZYRTEC Take 10 mg by mouth daily.   escitalopram 20 MG tablet Commonly known as: LEXAPRO Take 20 mg by mouth daily.   fluticasone 50 MCG/ACT nasal spray Commonly known as: FLONASE Place 1 spray into both nostrils daily as needed for allergies or rhinitis.   gabapentin 300 MG capsule Commonly known as: NEURONTIN Take 300 mg by mouth 2 (two) times daily.   Lumigan 0.01 % Soln Generic drug: bimatoprost Place 1 drop into both eyes at bedtime.   methocarbamol 500 MG tablet Commonly known as: ROBAXIN Take 1 tablet (500 mg total) by mouth every 6 (six) hours as needed for muscle spasms.   metoprolol succinate 50 MG 24 hr tablet Commonly known as: TOPROL-XL Take 50 mg by mouth daily. Take with or  immediately following a meal.   multivitamin with minerals Tabs tablet Take 1 tablet by mouth daily.   nicotine 21 mg/24hr patch Commonly known as: NICODERM CQ - dosed in mg/24 hours Place 21 mg onto the skin daily.   omeprazole 20 MG capsule Commonly known as: PRILOSEC Take 20 mg by mouth daily.   oxyCODONE-acetaminophen 5-325 MG tablet Commonly known as: PERCOCET/ROXICET Take 1-2 tablets by mouth every 4 (four) hours as needed for moderate pain or severe pain.       Diagnostic Studies: DG Cervical Spine 2-3 Views  Result Date: 05/15/2019 CLINICAL DATA:  Cervical fusion. EXAM: CERVICAL SPINE -  2-3 VIEW; DG C-ARM 1-60 MIN COMPARISON:  Radiographs 05/01/2019 FINDINGS: Intraoperative spot films of the cervical spine demonstrate anterior and interbody fusion changes at C5-6. Well-positioned hardware without complicating features. IMPRESSION: Anterior and interbody fusion changes at C5-6. Electronically Signed   By: Rudie Meyer M.D.   On: 05/15/2019 12:55   DG C-Arm 1-60 Min  Result Date: 05/15/2019 CLINICAL DATA:  Cervical fusion. EXAM: CERVICAL SPINE - 2-3 VIEW; DG C-ARM 1-60 MIN COMPARISON:  Radiographs 05/01/2019 FINDINGS: Intraoperative spot films of the cervical spine demonstrate anterior and interbody fusion changes at C5-6. Well-positioned hardware without complicating features. IMPRESSION: Anterior and interbody fusion changes at C5-6. Electronically Signed   By: Rudie Meyer M.D.   On: 05/15/2019 12:55    Disposition: Discharge disposition: 01-Home or Self Care        POD #1 s/p ACDF, doing well  - encourage ambulation - Percocet for pain, Robaxin for muscle spasms -Scripts for pain sent to pharmacy electronically  -D/C instructions sheet printed and in chart -D/C today  -F/U in office 2 weeks   Signed: Eilene Ghazi Birch Gibson 05/22/2019, 11:22 AM

## 2019-09-07 ENCOUNTER — Encounter: Payer: Self-pay | Admitting: Emergency Medicine

## 2019-09-07 ENCOUNTER — Other Ambulatory Visit: Payer: Self-pay

## 2019-09-07 ENCOUNTER — Ambulatory Visit
Admission: EM | Admit: 2019-09-07 | Discharge: 2019-09-07 | Disposition: A | Discharge status: Home / Self Care | Payer: BLUE CROSS/BLUE SHIELD | Attending: Urgent Care | Admitting: Urgent Care

## 2019-09-07 ENCOUNTER — Ambulatory Visit: Payer: BLUE CROSS/BLUE SHIELD

## 2019-09-07 ENCOUNTER — Ambulatory Visit (INDEPENDENT_AMBULATORY_CARE_PROVIDER_SITE_OTHER): Payer: BLUE CROSS/BLUE SHIELD

## 2019-09-07 DIAGNOSIS — J069 Acute upper respiratory infection, unspecified: Secondary | ICD-10-CM

## 2019-09-07 DIAGNOSIS — M545 Low back pain: Secondary | ICD-10-CM | POA: Insufficient documentation

## 2019-09-07 DIAGNOSIS — F329 Major depressive disorder, single episode, unspecified: Secondary | ICD-10-CM | POA: Diagnosis not present

## 2019-09-07 DIAGNOSIS — G8929 Other chronic pain: Secondary | ICD-10-CM | POA: Insufficient documentation

## 2019-09-07 DIAGNOSIS — K219 Gastro-esophageal reflux disease without esophagitis: Secondary | ICD-10-CM | POA: Diagnosis not present

## 2019-09-07 DIAGNOSIS — F419 Anxiety disorder, unspecified: Secondary | ICD-10-CM | POA: Diagnosis not present

## 2019-09-07 DIAGNOSIS — Z7901 Long term (current) use of anticoagulants: Secondary | ICD-10-CM | POA: Insufficient documentation

## 2019-09-07 DIAGNOSIS — J4521 Mild intermittent asthma with (acute) exacerbation: Secondary | ICD-10-CM | POA: Diagnosis not present

## 2019-09-07 DIAGNOSIS — Z79899 Other long term (current) drug therapy: Secondary | ICD-10-CM | POA: Insufficient documentation

## 2019-09-07 DIAGNOSIS — Z20822 Contact with and (suspected) exposure to covid-19: Secondary | ICD-10-CM | POA: Insufficient documentation

## 2019-09-07 DIAGNOSIS — I10 Essential (primary) hypertension: Secondary | ICD-10-CM | POA: Diagnosis not present

## 2019-09-07 DIAGNOSIS — M1990 Unspecified osteoarthritis, unspecified site: Secondary | ICD-10-CM | POA: Insufficient documentation

## 2019-09-07 DIAGNOSIS — F17210 Nicotine dependence, cigarettes, uncomplicated: Secondary | ICD-10-CM | POA: Diagnosis not present

## 2019-09-07 LAB — SARS CORONAVIRUS 2 (TAT 6-24 HRS): SARS Coronavirus 2: NEGATIVE

## 2019-09-07 MED ORDER — AMOXICILLIN-POT CLAVULANATE 875-125 MG PO TABS: 1.0000 | ORAL_TABLET | Freq: Two times a day (BID) | ORAL | 0 refills | Status: DC

## 2019-09-07 MED ORDER — PREDNISONE 20 MG PO TABS: ORAL_TABLET | ORAL | 0 refills | Status: DC

## 2019-09-07 MED ORDER — IPRATROPIUM-ALBUTEROL 0.5-2.5 (3) MG/3ML IN SOLN
3.0000 mL | Freq: Once | RESPIRATORY_TRACT | Status: AC
  Administered 2019-09-07: 3 mL via RESPIRATORY_TRACT

## 2019-09-07 NOTE — ED Triage Notes (Signed)
Patient c/o cough and chest congestion since Thursday.  Patient has been COVID vaccine.  Patient reports SOB that started last night.  Patient has history of asthma.  Patient states that he started back smoking 3 months.  Patient c/o chronic lower back pain that has gotten worse over the past few days.  Patient denies fevers.

## 2019-09-07 NOTE — Discharge Instructions (Addendum)
If your breathing or shortness of breath worsen go to the emergency room.  Notify the anesthesiologist of your medications tomorrow.

## 2019-09-07 NOTE — ED Provider Notes (Signed)
[facility]    CSN: [id number] Arrival date & time: 09/07/19  1335      History   Chief Complaint Chief Complaint  Patient presents with  . Cough  . Shortness of Breath  . Back Pain    HPI Darrell Gibson is a 44 y.o. male.   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  Is experience shortness of breath started last night.  Has had his Covid vaccinations already.  Fortunately he started smoking again 3 months ago.  Son had very similar symptoms of cough but has since recovered after about 3 or 4 days.  The patient has albuterol inhalers and budesonide that he is using at home.  He also has severe nonradiating lower back pain that is chronic.  He has had epidural steroid injections in the past and actually RFA around 8 years ago.  He is scheduled for R FA this next week.  He states he is unable to find a comfortable position.  He thinks that his coughing may have flared the lumbar pain.        Past Medical History:  Diagnosis Date  . Anxiety   . Arthritis   . Asthma    daily albuterol  . Cervical radiculopathy    with disc herniation   . Depression   . GERD (gastroesophageal reflux disease)   . Hidradenitis 2017  . History of MRSA infection 2017   hidradenitis  . Hypertension   . Wears glasses   . Wears glasses     Patient Active Problem List   Diagnosis Date Noted  . Radiculopathy 05/15/2019    Past Surgical History:  Procedure Laterality Date  . ANTERIOR CERVICAL DECOMP/DISCECTOMY FUSION Left 05/15/2019   Procedure: ANTERIOR CERVICAL DECOMPRESSION FUSION, CERVICAL FIVE-SIX WITH INSTRUMENTATION AND ALLOGRAFT;  Surgeon: Phylliss Bob, MD;  Location: [place];  Service: Orthopedics;  Laterality: Left;  . BICEPT TENODESIS Right 01/30/2019   Procedure: BICEPS TENODESIS;  Surgeon: Hiram Gash, MD;  Location: [place];  Service: Orthopedics;  Laterality: Right;  . NO PAST SURGERIES          Home Medications    Prior to Admission medications   Medication Sig Start Date End Date Taking? Authorizing Provider  albuterol (VENTOLIN HFA) 108 (90 Base) MCG/ACT inhaler Inhale 2 puffs into the lungs every 6 (six) hours as needed for wheezing or shortness of breath.   Yes [provider]  cetirizine (ZYRTEC) 10 MG tablet Take 10 mg by mouth daily.   Yes Joline Salt, RN  escitalopram (LEXAPRO) 20 MG tablet Take 20 mg by mouth daily.   Yes [provider]  fluticasone (FLONASE) 50 MCG/ACT nasal spray Place 1 spray into both nostrils daily as needed for allergies or rhinitis.   Yes [provider]  metoprolol succinate (TOPROL-XL) 50 MG 24 hr tablet Take 50 mg by mouth daily. Take with or immediately following a meal.   Yes [provider]  Multiple Vitamin (MULTIVITAMIN WITH MINERALS) TABS tablet Take 1 tablet by mouth daily.   Yes [provider]  omeprazole (PRILOSEC) 20 MG capsule Take 20 mg by mouth daily.   Yes [provider]  gabapentin (NEURONTIN) 300 MG capsule Take 300 mg by mouth 2 (two) times daily.  09/07/19 Yes [provider]  acetaminophen (TYLENOL) 500 MG tablet Take 500 mg by mouth every 6 (six) hours as needed for moderate pain.  [provider]  amoxicillin-clavulanate (AUGMENTIN) 875-125 MG tablet Take 1 tablet by mouth every 12 (twelve) hours. 09/07/19   Ovid Curd P, PA-C  LUMIGAN 0.01 % SOLN Place 1 drop into both eyes at bedtime. 05/06/19   [provider]  nicotine (NICODERM CQ - DOSED IN MG/24 HOURS) 21 mg/24hr patch Place 21 mg onto the skin daily.    [provider]  predniSONE (DELTASONE) 20 MG tablet Take 2 tablets (40 mg) daily by mouth 09/07/19   Lutricia Feil, PA-C    Family History Family History  Problem Relation Age of Onset  . COPD Mother   . Heart failure Mother   . Diabetes Mother     Social History Social History   Tobacco Use  .  Smoking status: Current Every Day Smoker    Types: Cigarettes    Last attempt to quit: 05/10/2019    Years since quitting: 0.3  . Smokeless tobacco: Never Used  Substance Use Topics  . Alcohol use: Yes    Comment: 3 beers a day 5 days a week  . Drug use: No    Comment: CBD      Allergies   Ivp dye [iodinated diagnostic agents] and Diclofenac   Review of Systems Review of Systems  Constitutional: Positive for activity change. Negative for appetite change, chills, fatigue and fever.  Respiratory: Positive for cough, shortness of breath and wheezing.   Musculoskeletal: Positive for back pain.  All other systems reviewed and are negative.    Physical Exam Triage Vital Signs ED Triage Vitals  Enc Vitals Group     BP 09/07/19 1400 122/61     Pulse Rate 09/07/19 1400 96     Resp 09/07/19 1400 16     Temp 09/07/19 1400 98.1 F (36.7 C)     Temp Source 09/07/19 1400 Oral     SpO2 09/07/19 1400 99 %     Weight 09/07/19 1356 210 lb (95.3 kg)     Height 09/07/19 1356 5\' 6"  (1.676 m)     Head Circumference --      Peak Flow --      Pain Score 09/07/19 1356 9     Pain Loc --      Pain Edu? --      Excl. in GC? --    No data found.  Updated Vital Signs BP 122/61 (BP Location: Left Arm)   Pulse 98   Temp 98.1 F (36.7 C) (Oral)   Resp 16   Ht 5\' 6"  (1.676 m)   Wt 210 lb (95.3 kg)   SpO2 100%   BMI 33.89 kg/m   Visual Acuity Right Eye Distance:   Left Eye Distance:   Bilateral Distance:    Right Eye Near:   Left Eye Near:    Bilateral Near:     Physical Exam Vitals and nursing note reviewed.  Constitutional:      General: He is in acute distress.     Appearance: He is well-developed. He is not ill-appearing or toxic-appearing.  HENT:     Head: Normocephalic and atraumatic.  Pulmonary:     Effort: Pulmonary effort is normal.     Breath sounds: Examination of the right-middle field reveals rhonchi. Examination of the left-middle field reveals rhonchi.  Examination of the right-lower field reveals rhonchi. Examination of the left-lower field reveals rhonchi. Wheezing and rhonchi present.  Musculoskeletal:        General: Normal range of motion.  Cervical back: Normal range of motion and neck supple.  Skin:    General: Skin is warm and dry.  Neurological:     General: No focal deficit present.     Mental Status: He is alert and oriented to person, place, and time.  Psychiatric:        Mood and Affect: Mood normal.        Behavior: Behavior normal.      UC Treatments / Results  Labs (all labs ordered are listed, but only abnormal results are displayed) Labs Reviewed  SARS CORONAVIRUS 2 (TAT 6-24 HRS)    EKG   Radiology DG Chest 2 View  Result Date: 09/07/2019 CLINICAL DATA:  Cough, shortness of breath, asthma EXAM: CHEST - 2 VIEW COMPARISON:  01/14/2013 FINDINGS: The heart size and mediastinal contours are within normal limits. Both lungs are clear. The visualized skeletal structures are unremarkable. IMPRESSION: No acute abnormality of the lungs. Electronically Signed   By: Lauralyn Primes M.D.   On: 09/07/2019 14:36    Procedures Procedures (including critical care time)  Medications Ordered in UC Medications  ipratropium-albuterol (DUONEB) 0.5-2.5 (3) MG/3ML nebulizer solution 3 mL (3 mLs Nebulization Given 09/07/19 1446)    Initial Impression / Assessment and Plan / UC Course  I have reviewed the triage vital signs and the nursing notes.  Pertinent labs & imaging results that were available during my care of the patient were reviewed by me and considered in my medical decision making (see chart for details).   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  His son had the same illness several days prior but has completely recovered.  The patient has already received a Covid vaccines.  He reports shortness of breath started last night.  He does have a history of  asthma.  He has been using albuterol and budesonide at home without much success.  Unfortunately started smoking again 3 months ago.  He is also having chronic low back pain that is nonradiating.  It is worsened over the last few days possibly from his coughing.  He has had no fever or chills.  Patient underwent chest x-ray which showed no acute abnormality of the lungs.  He also had Covid testing which will be available in 6 to 24 hours.  Physical exam revealed large amount of expiratory rhonchi and wheezing.  I suspect he has an upper respiratory infection and may have precipitated asthma exacerbation.  He received DuoNeb treatment which did not provide great relief but did improve his O2 sats to 100% from 99%.  He appears to be miserable with his low back pain but is undergoing RFA on Tuesday.  I will treat him for an acute asthma exacerbation with prednisone which may also help his low back pain and short course of Augmentin.  He is to notify his anesthesiologist performing the RFA of the medication.  If his breathing worsens or if he has more shortness of breath I have advised him to go immediately to the emergency room.   Final Clinical Impressions(s) / UC Diagnoses   Final diagnoses:  Upper respiratory tract infection, unspecified type  Mild intermittent asthma with acute exacerbation     Discharge Instructions     If your breathing or shortness of breath worsen go to the emergency room.  Notify the anesthesiologist of your medications tomorrow.    ED Prescriptions    Medication Sig Dispense Auth. Provider   predniSONE (  DELTASONE) 20 MG tablet Take 2 tablets (40 mg) daily by mouth 8 tablet Ovid Curd P, PA-C   amoxicillin-clavulanate (AUGMENTIN) 875-125 MG tablet Take 1 tablet by mouth every 12 (twelve) hours. 14 tablet Lutricia Feil, PA-C     PDMP not reviewed this encounter.   Lutricia Feil, PA-C 09/07/19 1512

## 2020-02-09 ENCOUNTER — Encounter: Payer: Self-pay | Admitting: Family Medicine

## 2020-02-17 ENCOUNTER — Telehealth (INDEPENDENT_AMBULATORY_CARE_PROVIDER_SITE_OTHER): Payer: Self-pay | Admitting: Gastroenterology

## 2020-02-17 ENCOUNTER — Other Ambulatory Visit: Payer: Self-pay

## 2020-02-17 DIAGNOSIS — Z1211 Encounter for screening for malignant neoplasm of colon: Secondary | ICD-10-CM

## 2020-02-17 NOTE — Progress Notes (Signed)
Gastroenterology Pre-Procedure Review  Request Date: 02/24/20 Requesting Physician: Dr. Maximino Greenland  PATIENT REVIEW QUESTIONS: The patient responded to the following health history questions as indicated:    1. Are you having any GI issues? no 2. Do you have a personal history of Polyps? no 3. Do you have a family history of Colon Cancer or Polyps? unsure 4. Diabetes Mellitus? no 5. Joint replacements in the past 12 months?no 6. Major health problems in the past 3 months?no 7. Any artificial heart valves, MVP, or defibrillator?no    MEDICATIONS & ALLERGIES:    Patient reports the following regarding taking any anticoagulation/antiplatelet therapy:   Plavix, Coumadin, Eliquis, Xarelto, Lovenox, Pradaxa, Brilinta, or Effient? no Aspirin? no  Patient confirms/reports the following medications:  Current Outpatient Medications  Medication Sig Dispense Refill  . cetirizine (ZYRTEC) 10 MG tablet Take 10 mg by mouth daily.    Marland Kitchen escitalopram (LEXAPRO) 20 MG tablet Take 20 mg by mouth daily.    . fluticasone (FLONASE) 50 MCG/ACT nasal spray Place 1 spray into both nostrils daily as needed for allergies or rhinitis.    Marland Kitchen LUMIGAN 0.01 % SOLN Place 1 drop into both eyes at bedtime.    . metoprolol succinate (TOPROL-XL) 50 MG 24 hr tablet Take 50 mg by mouth daily. Take with or immediately following a meal.    . Multiple Vitamin (MULTIVITAMIN WITH MINERALS) TABS tablet Take 1 tablet by mouth daily.    . nicotine (NICODERM CQ - DOSED IN MG/24 HOURS) 21 mg/24hr patch Place 21 mg onto the skin daily.    Marland Kitchen omeprazole (PRILOSEC) 20 MG capsule Take 20 mg by mouth daily.    Marland Kitchen acetaminophen (TYLENOL) 500 MG tablet Take 500 mg by mouth every 6 (six) hours as needed for moderate pain. (Patient not taking: Reported on 02/17/2020)    . albuterol (VENTOLIN HFA) 108 (90 Base) MCG/ACT inhaler Inhale 2 puffs into the lungs every 6 (six) hours as needed for wheezing or shortness of breath. (Patient not taking:  Reported on 02/17/2020)    . amoxicillin-clavulanate (AUGMENTIN) 875-125 MG tablet Take 1 tablet by mouth every 12 (twelve) hours. (Patient not taking: Reported on 02/17/2020) 14 tablet 0  . predniSONE (DELTASONE) 20 MG tablet Take 2 tablets (40 mg) daily by mouth (Patient not taking: Reported on 02/17/2020) 8 tablet 0   No current facility-administered medications for this visit.    Patient confirms/reports the following allergies:  Allergies  Allergen Reactions  . Ivp Dye [Iodinated Diagnostic Agents] Anaphylaxis  . Diclofenac Itching and Rash    Orders Placed This Encounter  Procedures  . Procedural/ Surgical Case Request: COLONOSCOPY WITH PROPOFOL    Standing Status:   Standing    Number of Occurrences:   1    Order Specific Question:   Pre-op diagnosis    Answer:   screening colonoscopy    Order Specific Question:   CPT Code    Answer:   02409    AUTHORIZATION INFORMATION Primary Insurance: 1D#: Group #:  Secondary Insurance: 1D#: Group #:  SCHEDULE INFORMATION: Date: 02/24/20 Time: Location:[facility]

## 2020-02-19 ENCOUNTER — Encounter: Payer: Self-pay | Admitting: Gastroenterology

## 2020-02-19 ENCOUNTER — Other Ambulatory Visit: Payer: Self-pay

## 2020-02-20 ENCOUNTER — Other Ambulatory Visit
Admission: RE | Admit: 2020-02-20 | Discharge: 2020-02-20 | Disposition: A | Discharge status: Home / Self Care | Payer: BLUE CROSS/BLUE SHIELD | Source: Ambulatory Visit | Attending: Gastroenterology | Admitting: Gastroenterology

## 2020-02-20 DIAGNOSIS — Z01812 Encounter for preprocedural laboratory examination: Secondary | ICD-10-CM | POA: Insufficient documentation

## 2020-02-20 DIAGNOSIS — Z20822 Contact with and (suspected) exposure to covid-19: Secondary | ICD-10-CM | POA: Diagnosis not present

## 2020-02-20 LAB — SARS CORONAVIRUS 2 (TAT 6-24 HRS): SARS Coronavirus 2: NEGATIVE

## 2020-02-23 NOTE — Discharge Instructions (Signed)
General Anesthesia, Adult, Care After This sheet gives you information about how to care for yourself after your procedure. Your health care provider may also give you more specific instructions. If you have problems or questions, contact your health care provider. What can I expect after the procedure? After the procedure, the following side effects are common:  Pain or discomfort at the IV site.  Nausea.  Vomiting.  Sore throat.  Trouble concentrating.  Feeling cold or chills.  Weak or tired.  Sleepiness and fatigue.  Soreness and body aches. These side effects can affect parts of the body that were not involved in surgery. Follow these instructions at home:  For at least 24 hours after the procedure:  Have a responsible adult stay with you. It is important to have someone help care for you until you are awake and alert.  Rest as needed.  Do not: ? Participate in activities in which you could fall or become injured. ? Drive. ? Use heavy machinery. ? Drink alcohol. ? Take sleeping pills or medicines that cause drowsiness. ? Make important decisions or sign legal documents. ? Take care of children on your own. Eating and drinking  Follow any instructions from your health care provider about eating or drinking restrictions.  When you feel hungry, start by eating small amounts of foods that are soft and easy to digest (bland), such as toast. Gradually return to your regular diet.  Drink enough fluid to keep your urine pale yellow.  If you vomit, rehydrate by drinking water, juice, or clear broth. General instructions  If you have sleep apnea, surgery and certain medicines can increase your risk for breathing problems. Follow instructions from your health care provider about wearing your sleep device: ? Anytime you are sleeping, including during daytime naps. ? While taking prescription pain medicines, sleeping medicines, or medicines that make you drowsy.  Return to  your normal activities as told by your health care provider. Ask your health care provider what activities are safe for you.  Take over-the-counter and prescription medicines only as told by your health care provider.  If you smoke, do not smoke without supervision.  Keep all follow-up visits as told by your health care provider. This is important. Contact a health care provider if:  You have nausea or vomiting that does not get better with medicine.  You cannot eat or drink without vomiting.  You have pain that does not get better with medicine.  You are unable to pass urine.  You develop a skin rash.  You have a fever.  You have redness around your IV site that gets worse. Get help right away if:  You have difficulty breathing.  You have chest pain.  You have blood in your urine or stool, or you vomit blood. Summary  After the procedure, it is common to have a sore throat or nausea. It is also common to feel tired.  Have a responsible adult stay with you for the first 24 hours after general anesthesia. It is important to have someone help care for you until you are awake and alert.  When you feel hungry, start by eating small amounts of foods that are soft and easy to digest (bland), such as toast. Gradually return to your regular diet.  Drink enough fluid to keep your urine pale yellow.  Return to your normal activities as told by your health care provider. Ask your health care provider what activities are safe for you. This information is not   intended to replace advice given to you by your health care provider. Make sure you discuss any questions you have with your health care provider. Document Revised: 04/06/2017 Document Reviewed: 11/17/2016 Elsevier Patient Education  2020 [employer]  

## 2020-02-24 ENCOUNTER — Other Ambulatory Visit: Payer: Self-pay

## 2020-02-24 ENCOUNTER — Encounter
Admission: RE | Disposition: A | Discharge status: Home / Self Care | Payer: Self-pay | Source: Home / Self Care | Attending: Gastroenterology

## 2020-02-24 ENCOUNTER — Ambulatory Visit: Payer: BLUE CROSS/BLUE SHIELD | Admitting: Anesthesiology

## 2020-02-24 ENCOUNTER — Encounter: Payer: Self-pay | Admitting: Gastroenterology

## 2020-02-24 ENCOUNTER — Ambulatory Visit
Admission: RE | Admit: 2020-02-24 | Discharge: 2020-02-24 | Disposition: A | Discharge status: Home / Self Care | Payer: BLUE CROSS/BLUE SHIELD | Attending: Gastroenterology | Admitting: Gastroenterology

## 2020-02-24 DIAGNOSIS — K635 Polyp of colon: Secondary | ICD-10-CM | POA: Insufficient documentation

## 2020-02-24 DIAGNOSIS — Z79899 Other long term (current) drug therapy: Secondary | ICD-10-CM | POA: Insufficient documentation

## 2020-02-24 DIAGNOSIS — Z7951 Long term (current) use of inhaled steroids: Secondary | ICD-10-CM | POA: Insufficient documentation

## 2020-02-24 DIAGNOSIS — J45909 Unspecified asthma, uncomplicated: Secondary | ICD-10-CM | POA: Insufficient documentation

## 2020-02-24 DIAGNOSIS — Z1211 Encounter for screening for malignant neoplasm of colon: Secondary | ICD-10-CM | POA: Diagnosis not present

## 2020-02-24 DIAGNOSIS — I10 Essential (primary) hypertension: Secondary | ICD-10-CM | POA: Diagnosis not present

## 2020-02-24 DIAGNOSIS — Z8249 Family history of ischemic heart disease and other diseases of the circulatory system: Secondary | ICD-10-CM | POA: Diagnosis not present

## 2020-02-24 DIAGNOSIS — Z833 Family history of diabetes mellitus: Secondary | ICD-10-CM | POA: Diagnosis not present

## 2020-02-24 DIAGNOSIS — Z91041 Radiographic dye allergy status: Secondary | ICD-10-CM | POA: Insufficient documentation

## 2020-02-24 DIAGNOSIS — K219 Gastro-esophageal reflux disease without esophagitis: Secondary | ICD-10-CM | POA: Insufficient documentation

## 2020-02-24 DIAGNOSIS — Z825 Family history of asthma and other chronic lower respiratory diseases: Secondary | ICD-10-CM | POA: Insufficient documentation

## 2020-02-24 DIAGNOSIS — K648 Other hemorrhoids: Secondary | ICD-10-CM | POA: Diagnosis not present

## 2020-02-24 DIAGNOSIS — Z888 Allergy status to other drugs, medicaments and biological substances status: Secondary | ICD-10-CM | POA: Diagnosis not present

## 2020-02-24 DIAGNOSIS — Z87891 Personal history of nicotine dependence: Secondary | ICD-10-CM | POA: Insufficient documentation

## 2020-02-24 HISTORY — DX: Anesthesia of skin: R20.0

## 2020-02-24 HISTORY — PX: POLYPECTOMY: SHX5525

## 2020-02-24 HISTORY — PX: COLONOSCOPY WITH PROPOFOL: SHX5780

## 2020-02-24 SURGERY — COLONOSCOPY WITH PROPOFOL
Anesthesia: General | Site: Rectum

## 2020-02-24 MED ORDER — PROPOFOL 500 MG/50ML IV EMUL
INTRAVENOUS | Status: DC | PRN
  Administered 2020-02-24: 120 ug/kg/min via INTRAVENOUS

## 2020-02-24 MED ORDER — ACETAMINOPHEN 160 MG/5ML PO SOLN: 325.0000 mg | Freq: Once | ORAL | Status: DC

## 2020-02-24 MED ORDER — LIDOCAINE HCL (CARDIAC) PF 100 MG/5ML IV SOSY
PREFILLED_SYRINGE | INTRAVENOUS | Status: DC | PRN
  Administered 2020-02-24: 80 mg via INTRAVENOUS

## 2020-02-24 MED ORDER — PROPOFOL 10 MG/ML IV BOLUS
INTRAVENOUS | Status: DC | PRN
  Administered 2020-02-24: 40 mg via INTRAVENOUS
  Administered 2020-02-24: 60 mg via INTRAVENOUS
  Administered 2020-02-24: 20 mg via INTRAVENOUS
  Administered 2020-02-24: 40 mg via INTRAVENOUS

## 2020-02-24 MED ORDER — STERILE WATER FOR IRRIGATION IR SOLN
Status: DC | PRN
  Administered 2020-02-24: 1

## 2020-02-24 MED ORDER — ACETAMINOPHEN 325 MG PO TABS: 325.0000 mg | ORAL_TABLET | Freq: Once | ORAL | Status: DC

## 2020-02-24 MED ORDER — LACTATED RINGERS IV SOLN: INTRAVENOUS | Status: DC

## 2020-02-24 SURGICAL SUPPLY — 8 items
GOWN CVR UNV OPN BCK APRN NK (MISCELLANEOUS) ×2 IMPLANT
GOWN ISOL THUMB LOOP REG UNIV (MISCELLANEOUS) ×6
KIT PRC NS LF DISP ENDO (KITS) ×1 IMPLANT
KIT PROCEDURE OLYMPUS (KITS) ×3
MANIFOLD NEPTUNE II (INSTRUMENTS) ×3 IMPLANT
SNARE COLD EXACTO (MISCELLANEOUS) ×3 IMPLANT
TRAP ETRAP POLY (MISCELLANEOUS) ×3 IMPLANT
WATER STERILE IRR 250ML POUR (IV SOLUTION) ×3 IMPLANT

## 2020-02-24 NOTE — Anesthesia Postprocedure Evaluation (Signed)
Anesthesia Post Note  Patient: Darrell Gibson  Procedure(s) Performed: COLONOSCOPY WITH BIOPSY (N/A Rectum) POLYPECTOMY (N/A Rectum)     Patient location during evaluation: PACU Anesthesia Type: General Level of consciousness: awake and alert and oriented Pain management: satisfactory to patient Vital Signs Assessment: post-procedure vital signs reviewed and stable Respiratory status: spontaneous breathing, nonlabored ventilation and respiratory function stable Cardiovascular status: blood pressure returned to baseline and stable Postop Assessment: Adequate PO intake and No signs of nausea or vomiting Anesthetic complications: no   No complications documented.  Cherly Beach

## 2020-02-24 NOTE — Anesthesia Procedure Notes (Signed)
Date/Time: 02/24/2020 9:39 AM Performed by: Lily Kocher, CRNA Pre-anesthesia Checklist: Patient identified, Emergency Drugs available, Suction available, Patient being monitored and Timeout performed Oxygen Delivery Method: Nasal cannula Placement Confirmation: positive ETCO2

## 2020-02-24 NOTE — Anesthesia Preprocedure Evaluation (Signed)
Anesthesia Evaluation  Patient identified by MRN, date of birth, ID band Patient awake    Reviewed: Allergy & Precautions, H&P , NPO status , Patient's Chart, lab work & pertinent test results  Airway Mallampati: II  TM Distance: >3 FB Neck ROM: full    Dental no notable dental hx.    Pulmonary asthma , former smoker,    Pulmonary exam normal breath sounds clear to auscultation       Cardiovascular hypertension, Normal cardiovascular exam Rhythm:regular Rate:Normal     Neuro/Psych PSYCHIATRIC DISORDERS    GI/Hepatic GERD  ,  Endo/Other    Renal/GU      Musculoskeletal   Abdominal   Peds  Hematology   Anesthesia Other Findings   Reproductive/Obstetrics                             Anesthesia Physical Anesthesia Plan  ASA: II  Anesthesia Plan: General   Post-op Pain Management:    Induction: Intravenous  PONV Risk Score and Plan: 2 and Treatment may vary due to age or medical condition, TIVA and Propofol infusion  Airway Management Planned: Natural Airway  Additional Equipment:   Intra-op Plan:   Post-operative Plan:   Informed Consent: I have reviewed the patients History and Physical, chart, labs and discussed the procedure including the risks, benefits and alternatives for the proposed anesthesia with the patient or authorized representative who has indicated his/her understanding and acceptance.     Dental Advisory Given  Plan Discussed with: CRNA  Anesthesia Plan Comments:         Anesthesia Quick Evaluation

## 2020-02-24 NOTE — Op Note (Signed)
[facility] Gastroenterology Patient Name: Darrell Gibson Procedure Date: 02/24/2020 9:35 AM MRN: [id number] Account #: [id number] Date of Birth: 1975/10/22 Admit Type: Outpatient Age: 44 Room: [facility] OR ROOM 01 Gender: Male Note Status: Finalized Procedure:             Colonoscopy Indications:           Screening for colorectal malignant neoplasm Providers:             Joban Colledge B. Maximino Greenland MD, MD Referring MD:          Dortha Kern (Referring MD) Medicines:             Monitored Anesthesia Care Complications:         No immediate complications. Procedure:             Pre-Anesthesia Assessment:                        - ASA Grade Assessment: II - A patient with mild                         systemic disease.                        - Prior to the procedure, a History and Physical was                         performed, and patient medications, allergies and                         sensitivities were reviewed. The patient's tolerance                         of previous anesthesia was reviewed.                        - The risks and benefits of the procedure and the                         sedation options and risks were discussed with the                         patient. All questions were answered and informed                         consent was obtained.                        - Patient identification and proposed procedure were                         verified prior to the procedure by the physician, the                         nurse, the anesthesiologist, the anesthetist and the                         technician. The procedure was verified in the                         procedure room.  After obtaining informed consent, the colonoscope was                         passed under direct vision. Throughout the procedure,                         the patient's blood pressure, pulse, and oxygen                         saturations were monitored  continuously. The                         Colonoscope was introduced through the anus and                         advanced to the the cecum, identified by appendiceal                         orifice and ileocecal valve. The colonoscopy was                         performed with ease. The patient tolerated the                         procedure well. The quality of the bowel preparation                         was fair. Findings:      The perianal and digital rectal examinations were normal.      A 6 mm polyp was found in the sigmoid colon. The polyp was sessile. The       polyp was removed with a cold snare. Resection and retrieval were       complete.      The exam was otherwise without abnormality.      The rectum, sigmoid colon, descending colon, transverse colon, ascending       colon and cecum appeared normal.      Non-bleeding internal hemorrhoids were found during retroflexion. Impression:            - Preparation of the colon was fair.                        - One 6 mm polyp in the sigmoid colon, removed with a                         cold snare. Resected and retrieved.                        - The examination was otherwise normal.                        - The rectum, sigmoid colon, descending colon,                         transverse colon, ascending colon and cecum are normal.                        - Non-bleeding internal hemorrhoids. Recommendation:        - Discharge patient  to home (with escort).                        - Advance diet as tolerated.                        - Continue present medications.                        - Await pathology results.                        - Repeat colonoscopy in 1-2 years with 2 day prep.                        - The findings and recommendations were discussed with                         the patient.                        - The findings and recommendations were discussed with                         the patient's family.                         - Return to primary care physician as previously                         scheduled.                        - High fiber diet. Procedure Code(s):     --- Professional ---                        [phone], Colonoscopy, flexible; with removal of                         tumor(s), polyp(s), or other lesion(s) by snare                         technique Diagnosis Code(s):     --- Professional ---                        Z12.11, Encounter for screening for malignant neoplasm                         of colon                        K63.5, Polyp of colon CPT copyright 2019 American Medical Association. All rights reserved. The codes documented in this report are preliminary and upon coder review may  be revised to meet current compliance requirements.  Melodie Bouillon, MD Michel Bickers B. Maximino Greenland MD, MD 02/24/2020 10:13:39 AM This report has been signed electronically. Number of Addenda: 0 Note Initiated On: 02/24/2020 9:35 AM Scope Withdrawal Time: 0 hours 18 minutes 4 seconds  Total Procedure Duration: 0 hours 23 minutes 5 seconds  Estimated Blood Loss:  Estimated blood loss: none.      [facility]

## 2020-02-24 NOTE — Transfer of Care (Signed)
Immediate Anesthesia Transfer of Care Note  Patient: Darrell Gibson  Procedure(s) Performed: COLONOSCOPY WITH BIOPSY (N/A Rectum) POLYPECTOMY (N/A Rectum)  Patient Location: PACU  Anesthesia Type: General  Level of Consciousness: awake, alert  and patient cooperative  Airway and Oxygen Therapy: Patient Spontanous Breathing and Patient connected to supplemental oxygen  Post-op Assessment: Post-op Vital signs reviewed, Patient's Cardiovascular Status Stable, Respiratory Function Stable, Patent Airway and No signs of Nausea or vomiting  Post-op Vital Signs: Reviewed and stable  Complications: No complications documented.

## 2020-02-24 NOTE — H&P (Signed)
Melodie Bouillon, MD [address], [place], [place], [address] [address], [place], [place], [address] Phone: [phone]  Fax: [phone]  Primary Care Physician:  Dortha Kern, MD   Pre-Procedure History & Physical: HPI:  Darrell Gibson is a 43 y.o. male is here for a colonoscopy.   Past Medical History:  Diagnosis Date  . Anxiety   . Arthritis   . Asthma    daily albuterol  . Cervical radiculopathy    with disc herniation   . Depression   . GERD (gastroesophageal reflux disease)   . Hidradenitis 2017  . History of MRSA infection 2017   hidradenitis  . Hypertension   . Numbness    left thigh  . Wears glasses     Past Surgical History:  Procedure Laterality Date  . ANTERIOR CERVICAL DECOMP/DISCECTOMY FUSION Left 05/15/2019   Procedure: ANTERIOR CERVICAL DECOMPRESSION FUSION, CERVICAL FIVE-SIX WITH INSTRUMENTATION AND ALLOGRAFT;  Surgeon: Estill Bamberg, MD;  Location: [facility] OR;  Service: Orthopedics;  Laterality: Left;  . BICEPT TENODESIS Right 01/30/2019   Procedure: BICEPS TENODESIS;  Surgeon: Bjorn Pippin, MD;  Location: [facility];  Service: Orthopedics;  Laterality: Right;    Prior to Admission medications   Medication Sig Start Date End Date Taking? Authorizing Provider  cetirizine (ZYRTEC) 10 MG tablet Take 10 mg by mouth daily.   Yes Alver Fisher, RN  escitalopram (LEXAPRO) 20 MG tablet Take 20 mg by mouth daily.   Yes [provider]  fluticasone (FLONASE) 50 MCG/ACT nasal spray Place 1 spray into both nostrils daily as needed for allergies or rhinitis.   Yes [provider]  LUMIGAN 0.01 % SOLN Place 1 drop into both eyes at bedtime. 05/06/19  Yes [provider]  metoprolol succinate (TOPROL-XL) 50 MG 24 hr tablet Take 50 mg by mouth daily. Take with or immediately following a meal.   Yes [provider]  Multiple Vitamin (MULTIVITAMIN WITH MINERALS) TABS tablet Take 1 tablet  by mouth daily.   Yes [provider]  nicotine (NICODERM CQ - DOSED IN MG/24 HOURS) 21 mg/24hr patch Place 21 mg onto the skin daily.   Yes [provider]  omeprazole (PRILOSEC) 20 MG capsule Take 20 mg by mouth daily.   Yes [provider]  acetaminophen (TYLENOL) 500 MG tablet Take 500 mg by mouth every 6 (six) hours as needed for moderate pain. Patient not taking: Reported on 02/17/2020    [provider]  albuterol (VENTOLIN HFA) 108 (90 Base) MCG/ACT inhaler Inhale 2 puffs into the lungs every 6 (six) hours as needed for wheezing or shortness of breath. Patient not taking: Reported on 02/17/2020    [provider]  gabapentin (NEURONTIN) 300 MG capsule Take 300 mg by mouth 2 (two) times daily.  09/07/19  [provider]    Allergies as of 02/17/2020 - Review Complete 02/17/2020  Allergen Reaction Noted  . Ivp dye [iodinated diagnostic agents] Anaphylaxis 03/12/2015  . Diclofenac Itching and Rash 05/12/2019    Family History  Problem Relation Age of Onset  . COPD Mother   . Heart failure Mother   . Diabetes Mother     Social History   Socioeconomic History  . Marital status: Married    Spouse name: Not on file  . Number of children: Not on file  . Years of education: Not on file  . Highest education level: Not on file  Occupational History  .  Not on file  Tobacco Use  . Smoking status: Former Smoker    Types: Cigarettes    Quit date: 01/30/2020    Years since quitting: 0.0  . Smokeless tobacco: Never Used  Vaping Use  . Vaping Use: Never used  Substance and Sexual Activity  . Alcohol use: Yes    Alcohol/week: 24.0 standard drinks    Types: 24 Cans of beer per week    Comment: 6 beers every other day  . Drug use: No    Comment: CBD   . Sexual activity: Yes  Other Topics Concern  . Not on file  Social History Narrative  . Not on file   Social Determinants of Health   Financial Resource Strain:   .  Difficulty of Paying Living Expenses: Not on file  Food Insecurity:   . Worried About Programme researcher, broadcasting/film/video in the Last Year: Not on file  . Ran Out of Food in the Last Year: Not on file  Transportation Needs:   . Lack of Transportation (Medical): Not on file  . Lack of Transportation (Non-Medical): Not on file  Physical Activity:   . Days of Exercise per Week: Not on file  . Minutes of Exercise per Session: Not on file  Stress:   . Feeling of Stress : Not on file  Social Connections:   . Frequency of Communication with Friends and Family: Not on file  . Frequency of Social Gatherings with Friends and Family: Not on file  . Attends Religious Services: Not on file  . Active Member of Clubs or Organizations: Not on file  . Attends Banker Meetings: Not on file  . Marital Status: Not on file  Intimate Partner Violence:   . Fear of Current or Ex-Partner: Not on file  . Emotionally Abused: Not on file  . Physically Abused: Not on file  . Sexually Abused: Not on file    Review of Systems: See HPI, otherwise negative ROS  Physical Exam: BP (!) 144/103   Pulse 83   Temp (!) 97.2 F (36.2 C) (Temporal)   Resp 18   Ht 5\' 6"  (1.676 m)   Wt 102.5 kg   SpO2 97%   BMI 36.48 kg/m  General:   Alert,  pleasant and cooperative in NAD Head:  Normocephalic and atraumatic. Neck:  Supple; no masses or thyromegaly. Lungs:  Clear throughout to auscultation, normal respiratory effort.    Heart:  +S1, +S2, Regular rate and rhythm, No edema. Abdomen:  Soft, nontender and nondistended. Normal bowel sounds, without guarding, and without rebound.   Neurologic:  Alert and  oriented x4;  grossly normal neurologically.  Impression/Plan: Gibson is here for a colonoscopy to be performed for average risk screening.  Risks, benefits, limitations, and alternatives regarding  colonoscopy have been reviewed with the patient.  Questions have been answered.  All parties  agreeable.   Priscella Mann, MD  02/24/2020, 8:54 AM

## 2020-02-25 ENCOUNTER — Encounter: Payer: Self-pay | Admitting: Gastroenterology

## 2020-02-26 ENCOUNTER — Encounter: Payer: Self-pay | Admitting: Gastroenterology

## 2020-02-26 LAB — SURGICAL PATHOLOGY

## 2021-07-16 ENCOUNTER — Other Ambulatory Visit: Payer: Self-pay

## 2021-07-16 ENCOUNTER — Ambulatory Visit
Admission: EM | Admit: 2021-07-16 | Discharge: 2021-07-16 | Disposition: A | Discharge status: Home / Self Care | Payer: BLUE CROSS/BLUE SHIELD

## 2021-07-16 ENCOUNTER — Encounter: Payer: Self-pay | Admitting: Emergency Medicine

## 2021-07-16 NOTE — ED Triage Notes (Signed)
Patient c/o left big toe pain, redness and swelling that started about 2 weeks ago.  Patient denies fevers.  ?

## 2021-09-06 ENCOUNTER — Telehealth: Payer: Self-pay

## 2021-09-06 NOTE — Telephone Encounter (Signed)
Patient having general surgery first will call us back to schedule when he is ready

## 2021-09-15 ENCOUNTER — Encounter: Payer: Self-pay | Admitting: Surgery

## 2021-09-15 ENCOUNTER — Ambulatory Visit (INDEPENDENT_AMBULATORY_CARE_PROVIDER_SITE_OTHER): Payer: BLUE CROSS/BLUE SHIELD | Admitting: Surgery

## 2021-09-15 VITALS — BP 121/80 | HR 103 | Temp 98.9°F | Ht 66.0 in | Wt 211.8 lb

## 2021-09-15 DIAGNOSIS — K641 Second degree hemorrhoids: Secondary | ICD-10-CM

## 2021-09-15 NOTE — Patient Instructions (Addendum)
Everything we are feeling is internal and may benefit from hemorrhoid banding. This is something that Gastroenterology can do. Go ahead and schedule your colonoscopy.   You may benefit from some fiber supplementation(whole psyllium fiber) to help better with formed stools. Be sure to drink plenty of fluids.    Follow-up with our office in one month.   Please call and ask to speak with a nurse if you develop questions or concerns.

## 2021-09-15 NOTE — Progress Notes (Signed)
Patient ID: Darrell Gibson, male   DOB: 12-29-1975, 46 y.o.   MRN: [id number]  Chief Complaint: Hemorrhoidal pain  History of Present Illness Darrell Gibson is a 46 y.o. male with a 17-year history of internal hemorrhoids.  Nonbleeding internal hemorrhoids were confirmed on colonoscopy November 2021. About a month to a month and a half ago he began having increased pain with bowel movements that lingered after defecation, with associated bright red blood per rectum.  Denies any history of constipation reports he goes twice daily with relatively soft easy to move move bowel movements.  Denies ever straining.  No prior history of hemorrhoidal thrombosis.  He has utilized sitz bath's, Proctofoam and steroid creams.  Reports some degree of associated itching as well.  Denies any prolapse that he even considers reduction.  No family history of colon cancer.  Past Medical History Past Medical History:  Diagnosis Date   Anxiety    Arthritis    Asthma    daily albuterol   Cervical radiculopathy    with disc herniation    Depression    GERD (gastroesophageal reflux disease)    Hidradenitis 2017   History of MRSA infection 2017   hidradenitis   Hypertension    Numbness    left thigh   Wears glasses       Past Surgical History:  Procedure Laterality Date   ANTERIOR CERVICAL DECOMP/DISCECTOMY FUSION Left 05/15/2019   Procedure: ANTERIOR CERVICAL DECOMPRESSION FUSION, CERVICAL FIVE-SIX WITH INSTRUMENTATION AND ALLOGRAFT;  Surgeon: Estill Bamberg, MD;  Location: [facility] OR;  Service: Orthopedics;  Laterality: Left;   BICEPT TENODESIS Right 01/30/2019   Procedure: BICEPS TENODESIS;  Surgeon: Bjorn Pippin, MD;  Location: [facility];  Service: Orthopedics;  Laterality: Right;   COLONOSCOPY WITH PROPOFOL N/A 02/24/2020   Procedure: COLONOSCOPY WITH BIOPSY;  Surgeon: Pasty Spillers, MD;  Location[facility];  Service: Endoscopy;  Laterality: N/A;  priority 4    POLYPECTOMY N/A 02/24/2020   Procedure: POLYPECTOMY;  Surgeon: Pasty Spillers, MD;  Location[facility];  Service: Endoscopy;  Laterality: N/A;    Allergies  Allergen Reactions   Ivp Dye [Iodinated Contrast Media] Anaphylaxis   Diclofenac Itching and Rash    Current Outpatient Medications  Medication Sig Dispense Refill   acetaminophen (TYLENOL) 500 MG tablet Take 500 mg by mouth every 6 (six) hours as needed for moderate pain.     amphetamine-dextroamphetamine (ADDERALL XR) 10 MG 24 hr capsule Take 10 mg by mouth every morning.     celecoxib (CELEBREX) 200 MG capsule Take 200 mg by mouth 2 (two) times daily.     cetirizine (ZYRTEC) 10 MG tablet Take 10 mg by mouth daily.     escitalopram (LEXAPRO) 20 MG tablet Take 20 mg by mouth daily.     fluticasone (FLONASE) 50 MCG/ACT nasal spray Place 1 spray into both nostrils daily as needed for allergies or rhinitis.     hydrochlorothiazide (HYDRODIURIL) 25 MG tablet Take 25-50 mg by mouth daily.     irbesartan (AVAPRO) 150 MG tablet Take 150 mg by mouth daily.     LUMIGAN 0.01 % SOLN Place 1 drop into both eyes at bedtime.     metoprolol (TOPROL-XL) 200 MG 24 hr tablet Take 200 mg by mouth daily.     Multiple Vitamin (MULTIVITAMIN WITH MINERALS) TABS tablet Take 1 tablet by mouth daily.     nicotine (NICODERM CQ - DOSED IN MG/24 HOURS) 21  mg/24hr patch Place 21 mg onto the skin daily.     omeprazole (PRILOSEC) 20 MG capsule Take 20 mg by mouth daily.     PROCTOFOAM HC rectal foam SMARTSIG:Rectally 4 Times Daily     No current facility-administered medications for this visit.    Family History Family History  Problem Relation Age of Onset   COPD Mother    Heart failure Mother    Diabetes Mother       Social History Social History   Tobacco Use   Smoking status: Every Day    Types: Cigarettes    Last attempt to quit: 01/30/2020    Years since quitting: 1.6    Passive exposure: Past   Smokeless tobacco: Never   Vaping Use   Vaping Use: Never used  Substance Use Topics   Alcohol use: Yes    Alcohol/week: 24.0 standard drinks    Types: 24 Cans of beer per week    Comment: 6 beers every other day   Drug use: No    Comment: CBD      Review of Systems  Constitutional: Negative.   HENT: Negative.    Eyes: Negative.   Respiratory: Negative.    Cardiovascular: Negative.   Gastrointestinal:  Positive for abdominal pain and blood in stool.  Genitourinary: Negative.   Skin: Negative.   Neurological: Negative.   Psychiatric/Behavioral: Negative.       Physical Exam Blood pressure 121/80, pulse (!) 103, temperature 98.9 F (37.2 C), height 5\' 6"  (1.676 m), weight 211 lb 12.8 oz (96.1 kg), SpO2 97 %. Last Weight  Most recent update: 09/15/2021  4:13 PM    Weight  96.1 kg (211 lb 12.8 oz)             CONSTITUTIONAL: Well developed, and nourished, appropriately responsive and aware without distress.   EYES: Sclera non-icteric.   EARS, NOSE, MOUTH AND THROAT:  The oropharynx is clear. Oral mucosa is pink and moist.  Hearing is intact to voice.  NECK: Trachea is midline, and there is no jugular venous distension.  LYMPH NODES:  Lymph nodes in the neck are not enlarged. RESPIRATORY:  Lungs are clear, and breath sounds are equal bilaterally. Normal respiratory effort without pathologic use of accessory muscles. CARDIOVASCULAR: Heart is regular in rate and rhythm. GI: The abdomen is soft, nontender, and nondistended. There were no palpable masses. I did not appreciate hepatosplenomegaly. There were normal bowel sounds. GU: No external tags today of any note.  No perianal induration or tenderness.  His anal canal was long with high sphincter tone, he does have circumferential hemorrhoidal tissue without remarkable redundant piles.  It does feel as though there is diminished luminal diameter secondary to the fullness present.  There is some room producible tenderness anteriorly inferior to the  prostate.  Prostate otherwise feels essentially normal. MUSCULOSKELETAL:  Symmetrical muscle tone appreciated in all four extremities.    SKIN: Skin turgor is normal. No pathologic skin lesions appreciated.  NEUROLOGIC:  Motor and sensation appear grossly normal.  Cranial nerves are grossly without defect. PSYCH:  Alert and oriented to person, place and time. Affect is appropriate for situation.  Data Reviewed I have personally reviewed what is currently available of the patient's imaging, recent labs and medical records.   Labs:     Latest Ref Rng & Units 05/13/2019    1:50 PM 01/17/2012   12:19 PM 07/19/2011    6:12 PM  CBC  WBC 4.0 - 10.5 K/uL 13.7  9.1   14.2    Hemoglobin 13.0 - 17.0 g/dL 02.7   74.1   28.7    Hematocrit 39.0 - 52.0 % 47.0   46.1   50.1    Platelets 150 - 400 K/uL 402   364   319        Latest Ref Rng & Units 05/13/2019    1:50 PM 01/17/2012   12:19 PM 07/19/2011    6:12 PM  CMP  Glucose 70 - 99 mg/dL 83   90   867    BUN 6 - 20 mg/dL 16   9   17     Creatinine 0.61 - 1.24 mg/dL   6.72   0.94    Sodium 135 - 145 mmol/L 138   140   138    Potassium 3.5 - 5.1 mmol/L 3.9   3.7   4.0    Chloride 98 - 111 mmol/L 102   105   103    CO2 22 - 32 mmol/L 25   24   23     Calcium 8.9 - 10.3 mg/dL 9.3   9.2   8.9    Total Protein 6.5 - 8.1 g/dL 7.4   8.4   7.9    Total Bilirubin 0.3 - 1.2 mg/dL 0.4   0.4   0.5    Alkaline Phos 38 - 126 U/L 66   99   100    AST 15 - 41 U/L 38   27   23    ALT 0 - 44 U/L 63   37   34        Imaging:  Within last 24 hrs: No results found.  Assessment    Grade 2 hemorrhoids internal.  Symptomatic. Patient Active Problem List   Diagnosis Date Noted   Special screening for malignant neoplasms, colon    Polyp of sigmoid colon    Radiculopathy 05/15/2019    Plan    Advised to pursue a goal of 25 to 30 g of fiber daily.  Made aware that the majority of this may be through natural sources, but advised to be aware of actual  consumption and to ensure minimal consumption by daily supplementation.  Various forms of supplements discussed.  Recommended Psyllium husk, that mixes well with applesauce, or the powder which goes down well shaken with chocolate milk.  Strongly advised to consume more fluids to ensure adequate hydration, instructed to watch color of urine to determine adequacy of hydration.  Clarity is pursued in urine output, and bowel activity that correlates to significant meal intake.   We need to avoid deferring having bowel movements, advised to take the time at the first sign of sensation, typically following meals, and in the morning.   Subsequent utilization of MiraLAX may be needed ensure at least daily movement, ideally twice daily bowel movements.  If multiple doses of MiraLAX are necessary utilize them. Never skip a day...  To be regular, we must do the above EVERY day.   As long as he is planning to follow-up with the colonoscopy polyp, I would have him proceed with this and then determine on exam again should any consideration for banding be done.  Considering the long anal canal and the relative paucity of redundancy of tissue I am concerned about creating a bigger problem through surgical hemorrhoidectomy.  Regardless I have recommended fiber and fluids as noted above. Question if there may be some other source of the perineal pain/tenderness.  Though I am not suspicious sufficiently to warrant getting a pelvic MRI.  We will see how he does with the above, I advised against surgery at this point in time and will be glad to see him back in a month or 2 to see how he is progressing.  Face-to-face time spent with the patient and accompanying care providers(if present) was 30 minutes, with more than 50% of the time spent counseling, educating, and coordinating care of the patient.    These notes generated with voice recognition software. I apologize for typographical errors.  Campbell Lernerenny Shaolin Armas M.D.,  FACS 09/16/2021, 1:24 PM

## 2021-09-16 ENCOUNTER — Encounter: Payer: Self-pay | Admitting: Gastroenterology

## 2021-09-16 DIAGNOSIS — K641 Second degree hemorrhoids: Secondary | ICD-10-CM | POA: Insufficient documentation

## 2021-10-11 ENCOUNTER — Ambulatory Visit: Payer: BLUE CROSS/BLUE SHIELD | Admitting: Surgery
